# Patient Record
Sex: Male | Born: 1955 | Race: White | Hispanic: No | Marital: Married | Smoking: Never smoker
Health system: Southern US, Community
[De-identification: ages and names within clinical notes are randomized; demographics above are authoritative.]

## PROBLEM LIST (undated history)

## (undated) DIAGNOSIS — G8929 Other chronic pain: Secondary | ICD-10-CM

## (undated) DIAGNOSIS — L02416 Cutaneous abscess of left lower limb: Secondary | ICD-10-CM

## (undated) DIAGNOSIS — A419 Sepsis, unspecified organism: Secondary | ICD-10-CM

## (undated) DIAGNOSIS — L03116 Cellulitis of left lower limb: Secondary | ICD-10-CM

## (undated) DIAGNOSIS — Z87828 Personal history of other (healed) physical injury and trauma: Secondary | ICD-10-CM

## (undated) DIAGNOSIS — F329 Major depressive disorder, single episode, unspecified: Secondary | ICD-10-CM

## (undated) DIAGNOSIS — F32A Depression, unspecified: Secondary | ICD-10-CM

## (undated) DIAGNOSIS — R51 Headache: Secondary | ICD-10-CM

## (undated) DIAGNOSIS — R519 Headache, unspecified: Secondary | ICD-10-CM

## (undated) DIAGNOSIS — M549 Dorsalgia, unspecified: Secondary | ICD-10-CM

## (undated) HISTORY — PX: INTRATHECAL PUMP IMPLANTATION: SHX1844

## (undated) HISTORY — PX: ABDOMINAL SURGERY: SHX537

## (undated) HISTORY — PX: BACK SURGERY: SHX140

---

## 2008-10-06 DIAGNOSIS — S343XXA Injury of cauda equina, initial encounter: Secondary | ICD-10-CM | POA: Insufficient documentation

## 2012-06-29 DIAGNOSIS — K5900 Constipation, unspecified: Secondary | ICD-10-CM | POA: Insufficient documentation

## 2012-06-29 DIAGNOSIS — K219 Gastro-esophageal reflux disease without esophagitis: Secondary | ICD-10-CM | POA: Insufficient documentation

## 2012-06-29 DIAGNOSIS — N319 Neuromuscular dysfunction of bladder, unspecified: Secondary | ICD-10-CM | POA: Insufficient documentation

## 2012-06-29 DIAGNOSIS — F32A Depression, unspecified: Secondary | ICD-10-CM | POA: Diagnosis present

## 2012-06-29 DIAGNOSIS — M62838 Other muscle spasm: Secondary | ICD-10-CM | POA: Insufficient documentation

## 2012-10-30 DIAGNOSIS — I82601 Acute embolism and thrombosis of unspecified veins of right upper extremity: Secondary | ICD-10-CM | POA: Insufficient documentation

## 2013-10-30 DIAGNOSIS — R269 Unspecified abnormalities of gait and mobility: Secondary | ICD-10-CM | POA: Insufficient documentation

## 2014-05-01 DIAGNOSIS — G894 Chronic pain syndrome: Secondary | ICD-10-CM | POA: Diagnosis present

## 2014-10-07 DIAGNOSIS — T85615A Breakdown (mechanical) of other nervous system device, implant or graft, initial encounter: Secondary | ICD-10-CM | POA: Insufficient documentation

## 2014-10-07 DIAGNOSIS — R252 Cramp and spasm: Secondary | ICD-10-CM | POA: Insufficient documentation

## 2014-11-05 DIAGNOSIS — R319 Hematuria, unspecified: Secondary | ICD-10-CM | POA: Insufficient documentation

## 2015-04-19 DIAGNOSIS — F330 Major depressive disorder, recurrent, mild: Secondary | ICD-10-CM | POA: Insufficient documentation

## 2015-04-19 DIAGNOSIS — R5381 Other malaise: Secondary | ICD-10-CM | POA: Insufficient documentation

## 2015-04-19 DIAGNOSIS — E782 Mixed hyperlipidemia: Secondary | ICD-10-CM | POA: Insufficient documentation

## 2015-04-19 DIAGNOSIS — Z720 Tobacco use: Secondary | ICD-10-CM | POA: Insufficient documentation

## 2015-04-19 DIAGNOSIS — I10 Essential (primary) hypertension: Secondary | ICD-10-CM | POA: Insufficient documentation

## 2015-07-14 DIAGNOSIS — Z9889 Other specified postprocedural states: Secondary | ICD-10-CM | POA: Insufficient documentation

## 2016-07-23 DIAGNOSIS — L02416 Cutaneous abscess of left lower limb: Secondary | ICD-10-CM

## 2016-07-23 DIAGNOSIS — A419 Sepsis, unspecified organism: Secondary | ICD-10-CM

## 2016-07-23 DIAGNOSIS — L03116 Cellulitis of left lower limb: Secondary | ICD-10-CM

## 2016-07-23 HISTORY — DX: Cutaneous abscess of left lower limb: L02.416

## 2016-07-23 HISTORY — DX: Sepsis, unspecified organism: A41.9

## 2016-07-23 HISTORY — DX: Cellulitis of left lower limb: L03.116

## 2016-08-09 ENCOUNTER — Inpatient Hospital Stay (HOSPITAL_COMMUNITY)
Admission: EM | Admit: 2016-08-09 | Discharge: 2016-08-12 | DRG: 872 | Disposition: A | Discharge status: Home / Self Care | Payer: Medicare Other | Attending: Internal Medicine | Admitting: Internal Medicine

## 2016-08-09 ENCOUNTER — Encounter (HOSPITAL_COMMUNITY): Payer: Self-pay | Admitting: Emergency Medicine

## 2016-08-09 ENCOUNTER — Emergency Department (HOSPITAL_COMMUNITY): Payer: Medicare Other

## 2016-08-09 DIAGNOSIS — F329 Major depressive disorder, single episode, unspecified: Secondary | ICD-10-CM | POA: Diagnosis present

## 2016-08-09 DIAGNOSIS — G8929 Other chronic pain: Secondary | ICD-10-CM | POA: Diagnosis present

## 2016-08-09 DIAGNOSIS — G8222 Paraplegia, incomplete: Secondary | ICD-10-CM | POA: Diagnosis present

## 2016-08-09 DIAGNOSIS — L03116 Cellulitis of left lower limb: Secondary | ICD-10-CM | POA: Diagnosis present

## 2016-08-09 DIAGNOSIS — Z7982 Long term (current) use of aspirin: Secondary | ICD-10-CM

## 2016-08-09 DIAGNOSIS — Z79891 Long term (current) use of opiate analgesic: Secondary | ICD-10-CM

## 2016-08-09 DIAGNOSIS — A419 Sepsis, unspecified organism: Secondary | ICD-10-CM | POA: Diagnosis not present

## 2016-08-09 DIAGNOSIS — G894 Chronic pain syndrome: Secondary | ICD-10-CM | POA: Diagnosis present

## 2016-08-09 DIAGNOSIS — R740 Nonspecific elevation of levels of transaminase and lactic acid dehydrogenase [LDH]: Secondary | ICD-10-CM | POA: Diagnosis present

## 2016-08-09 DIAGNOSIS — Z8249 Family history of ischemic heart disease and other diseases of the circulatory system: Secondary | ICD-10-CM

## 2016-08-09 DIAGNOSIS — D696 Thrombocytopenia, unspecified: Secondary | ICD-10-CM | POA: Diagnosis present

## 2016-08-09 DIAGNOSIS — Z79899 Other long term (current) drug therapy: Secondary | ICD-10-CM

## 2016-08-09 DIAGNOSIS — F32A Depression, unspecified: Secondary | ICD-10-CM | POA: Diagnosis present

## 2016-08-09 DIAGNOSIS — N400 Enlarged prostate without lower urinary tract symptoms: Secondary | ICD-10-CM | POA: Diagnosis present

## 2016-08-09 DIAGNOSIS — I2510 Atherosclerotic heart disease of native coronary artery without angina pectoris: Secondary | ICD-10-CM | POA: Diagnosis present

## 2016-08-09 DIAGNOSIS — F17220 Nicotine dependence, chewing tobacco, uncomplicated: Secondary | ICD-10-CM | POA: Diagnosis present

## 2016-08-09 DIAGNOSIS — D649 Anemia, unspecified: Secondary | ICD-10-CM | POA: Diagnosis present

## 2016-08-09 HISTORY — DX: Headache, unspecified: R51.9

## 2016-08-09 HISTORY — DX: Headache: R51

## 2016-08-09 HISTORY — DX: Major depressive disorder, single episode, unspecified: F32.9

## 2016-08-09 HISTORY — DX: Sepsis, unspecified organism: A41.9

## 2016-08-09 HISTORY — DX: Cellulitis of left lower limb: L03.116

## 2016-08-09 HISTORY — DX: Depression, unspecified: F32.A

## 2016-08-09 HISTORY — DX: Dorsalgia, unspecified: M54.9

## 2016-08-09 HISTORY — DX: Cutaneous abscess of left lower limb: L02.416

## 2016-08-09 HISTORY — DX: Personal history of other (healed) physical injury and trauma: Z87.828

## 2016-08-09 HISTORY — DX: Other chronic pain: G89.29

## 2016-08-09 LAB — URINALYSIS, ROUTINE W REFLEX MICROSCOPIC
Bilirubin Urine: NEGATIVE
Glucose, UA: NEGATIVE mg/dL
HGB URINE DIPSTICK: NEGATIVE
Ketones, ur: NEGATIVE mg/dL
Leukocytes, UA: NEGATIVE
NITRITE: NEGATIVE
PH: 6 (ref 5.0–8.0)
Protein, ur: NEGATIVE mg/dL
SPECIFIC GRAVITY, URINE: 1.003 — AB (ref 1.005–1.030)

## 2016-08-09 LAB — CBC WITH DIFFERENTIAL/PLATELET
Basophils Absolute: 0 10*3/uL (ref 0.0–0.1)
Basophils Relative: 0 %
EOS ABS: 0.2 10*3/uL (ref 0.0–0.7)
Eosinophils Relative: 1 %
HEMATOCRIT: 36.8 % — AB (ref 39.0–52.0)
HEMOGLOBIN: 12.6 g/dL — AB (ref 13.0–17.0)
LYMPHS ABS: 1 10*3/uL (ref 0.7–4.0)
Lymphocytes Relative: 7 %
MCH: 29.9 pg (ref 26.0–34.0)
MCHC: 34.2 g/dL (ref 30.0–36.0)
MCV: 87.2 fL (ref 78.0–100.0)
MONOS PCT: 6 %
Monocytes Absolute: 0.9 10*3/uL (ref 0.1–1.0)
NEUTROS ABS: 12.4 10*3/uL — AB (ref 1.7–7.7)
NEUTROS PCT: 86 %
Platelets: 111 10*3/uL — ABNORMAL LOW (ref 150–400)
RBC: 4.22 MIL/uL (ref 4.22–5.81)
RDW: 14.1 % (ref 11.5–15.5)
WBC: 14.5 10*3/uL — AB (ref 4.0–10.5)

## 2016-08-09 LAB — I-STAT CG4 LACTIC ACID, ED: Lactic Acid, Venous: 1.1 mmol/L (ref 0.5–1.9)

## 2016-08-09 MED ORDER — VANCOMYCIN HCL IN DEXTROSE 1-5 GM/200ML-% IV SOLN
1000.0000 mg | Freq: Once | INTRAVENOUS | Status: AC
  Administered 2016-08-09: 1000 mg via INTRAVENOUS
  Filled 2016-08-09: qty 200

## 2016-08-09 NOTE — ED Provider Notes (Signed)
[facility]-EMERGENCY DEPT Provider Note   CSN: [id number] Arrival date & time: 08/09/16  2035    History   Chief Complaint Chief Complaint  Patient presents with  . Urinary Tract Infection    HPI Robert Mays is a 61 y.o. male.  61 year old male presents to the emergency department from urgent care secondary to concern for sepsis. Patient noted to be tachycardic with a fever of 102F at urgent care. He reports feeling "bad" recently. He states that his wife and daughter took his temperature at home and he was found to have a fever. He reports some mild nausea. Symptoms have, otherwise, the nonspecific. No recent cough or complaints of shortness of breath. No vomiting or diarrhea. Patient denies dysuria as well as sick contacts. No recent travel or known tick bites. Patient with a history of accident in 1998 leaving him with numbness from the midsternum distally. He has had issues with bowel and bladder incontinence since his most recent surgery in 2017. He is able to walk with a walker at baseline, though finds this difficult. He denies any significant changes from the baseline. Patient was given Tylenol and a shot of Rocephin at urgent care prior to transfer.      Past Medical History:  Diagnosis Date  . Chronic back pain   . H/O spinal cord injury     Patient Active Problem List   Diagnosis Date Noted  . Sepsis (HCC) 08/10/2016    Past Surgical History:  Procedure Laterality Date  . ABDOMINAL SURGERY    . BACK SURGERY        Home Medications    Prior to Admission medications   Medication Sig Start Date End Date Taking? Authorizing Provider  aspirin EC 81 MG tablet Take 81 mg by mouth daily.   Yes [provider]  baclofen (LIORESAL) 20 MG tablet Take 20 mg by mouth 3 (three) times daily. 05/16/16  Yes [provider]  gabapentin (NEURONTIN) 600 MG tablet Take 600 mg by mouth 4 (four) times daily.   Yes [provider]  omeprazole (PRILOSEC) 20 MG  capsule Take 20 mg by mouth daily.   Yes [provider]  Oxcarbazepine (TRILEPTAL) 300 MG tablet Take 300 mg by mouth 2 (two) times daily.   Yes [provider]  oxybutynin (OXYTROL) 3.9 MG/24HR Place 1 patch onto the skin every 3 (three) days.   Yes [provider]  oxycodone (ROXICODONE) 30 MG immediate release tablet Take 30 mg by mouth every 6 (six) hours as needed for pain.   Yes [provider]  tamsulosin (FLOMAX) 0.4 MG CAPS capsule Take 0.4 mg by mouth 3 (three) times daily.   Yes [provider]  venlafaxine (EFFEXOR) 75 MG tablet Take 75 mg by mouth 2 (two) times daily.   Yes [provider]    Family History Family History  Problem Relation Age of Onset  . Heart attack Father        Died in his 3840's    Social History Social History  Substance Use Topics  . Smoking status: Never Smoker  . Smokeless tobacco: Current User    Types: Chew  . Alcohol use No     Allergies   Patient has no known allergies.   Review of Systems Review of Systems Ten systems reviewed and are negative for acute change, except as noted in the HPI.    Physical Exam Updated Vital Signs BP 119/65   Pulse 70   Temp 98.5  F (36.9 C) (Oral)   Resp 13   Ht 5\' 6"  (1.676 m)   Wt 86.2 kg (190 lb)   SpO2 98%   BMI 30.67 kg/m   Physical Exam  Constitutional: He is oriented to person, place, and time. He appears well-developed and well-nourished. No distress.  Nontoxic appearing and in NAD  HENT:  Head: Normocephalic and atraumatic.  Mouth/Throat: Oropharynx is clear and moist.  Eyes: Conjunctivae and EOM are normal. No scleral icterus.  Neck: Normal range of motion.  No meningismus  Cardiovascular: Normal rate, regular rhythm and intact distal pulses.   Pulmonary/Chest: Effort normal. No respiratory distress. He has no wheezes. He has no rales.  Respirations even and unlabored. Lungs grossly clear bilaterally.   Abdominal: Soft. He  exhibits no mass. There is no tenderness. There is no guarding.  Soft, nontender abdomen. No rigidity.  Musculoskeletal: Normal range of motion.       Left lower leg: He exhibits swelling (mild) and edema (minimal, pitting). He exhibits no tenderness.       Legs: Neurological: He is alert and oriented to person, place, and time. Coordination normal.  Skin: Skin is warm and dry. No rash noted. He is not diaphoretic. There is erythema. No pallor.  Psychiatric: He has a normal mood and affect. His behavior is normal.  Nursing note and vitals reviewed.    ED Treatments / Results  Labs (all labs ordered are listed, but only abnormal results are displayed) Labs Reviewed  CBC WITH DIFFERENTIAL/PLATELET - Abnormal; Notable for the following:       Result Value   WBC 14.5 (*)    Hemoglobin 12.6 (*)    HCT 36.8 (*)    Platelets 111 (*)    Neutro Abs 12.4 (*)    All other components within normal limits  URINALYSIS, ROUTINE W REFLEX MICROSCOPIC - Abnormal; Notable for the following:    Color, Urine STRAW (*)    Specific Gravity, Urine 1.003 (*)    All other components within normal limits  COMPREHENSIVE METABOLIC PANEL - Abnormal; Notable for the following:    Sodium 132 (*)    CO2 20 (*)    Glucose, Bld 124 (*)    Calcium 8.3 (*)    Total Protein 6.1 (*)    Albumin 3.3 (*)    AST 76 (*)    All other components within normal limits  CULTURE, BLOOD (ROUTINE X 2)  CULTURE, BLOOD (ROUTINE X 2)  URINE CULTURE  I-STAT CG4 LACTIC ACID, ED    EKG  EKG Interpretation None       Radiology Dg Chest 2 View  Result Date: 08/09/2016 CLINICAL DATA:  Sepsis for several days.  Fever. EXAM: CHEST  2 VIEW COMPARISON:  None. FINDINGS: Heart size is top-normal. There is mild pulmonary vascular congestion consistent with mild CHF. No pneumonic consolidation, effusion or pneumothorax. Low lung volumes are noted. Lobular appearing contour to the mediastinum possibly representing an ectatic or  aneurysmal ascending aorta though other etiologies such is adenopathy or abutting mediastinal mass or not entirely excluded. No acute nor suspicious osseous abnormalities. Degenerative changes are seen along the dorsal spine. IMPRESSION: 1. Low lung volumes with mild vascular congestion consistent CHF. Borderline cardiomegaly. 2. Lobular appearing right mediastinal contour may reflect aneurysmal dilatation or ectasia of the thoracic aorta, adenopathy or mass. This can be further correlated with CT with IV contrast. Electronically Signed   By: Tollie Eth M.D.   On: 08/09/2016 21:24  Ct Angio Chest Pe W And/or Wo Contrast  Result Date: 08/10/2016 CLINICAL DATA:  Fever with possible sepsis. No chest pain or dyspnea. Abnormal appearance on chest x-ray. EXAM: CT ANGIOGRAPHY CHEST WITH CONTRAST TECHNIQUE: Multidetector CT imaging of the chest was performed using the standard protocol during bolus administration of intravenous contrast. Multiplanar CT image reconstructions and MIPs were obtained to evaluate the vascular anatomy. CONTRAST:  100 cc of Isovue 370 IV COMPARISON:  None. 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 No acute pulmonary embolus is identified. There is aortic atherosclerosis without dissection or aneurysm. There is coronary arteriosclerosis predominantly involving the LAD. Mediastinum/Nodes: No enlarged mediastinal, hilar, or axillary lymph nodes. Thyroid gland, trachea, and esophagus demonstrate no significant findings. Lungs/Pleura: Hypoventilatory changes are seen about both lungs likely accounting for the mild diffuse ground-glass opacities noted. Bibasilar atelectasis is seen at each lung base. No pneumonic consolidation is identified. Upper Abdomen: Small hiatal hernia. No acute abnormality within the included upper abdomen. Musculoskeletal:  Thoracic spondylosis. No acute nor suspicious osseous abnormalities. Chronic old left lower rib fractures. Review of the MIP images confirms the above findings. IMPRESSION: 1. The contour abnormality noted on the earlier same day chest radiograph is attributable to a tortuous thoracic aorta along side an the SVC-left brachiocephalic confluence. No focal aortic aneurysm, adenopathy or paramediastinal mass is noted to account for this finding. 2. Bibasilar dependent atelectasis with mild hypoventilatory change of the lungs accounting for ground-glass appearance of the lungs. 3. Coronary arteriosclerosis. Aortic Atherosclerosis (ICD10-I70.0). Electronically Signed   By: Tollie Eth M.D.   On: 08/10/2016 02:12    Procedures Procedures (including critical care time)  Medications Ordered in ED Medications  oxyCODONE (Oxy IR/ROXICODONE) immediate release tablet 30 mg (30 mg Oral Given 08/10/16 0343)  oxybutynin (OXYTROL) 3.9 MG/24HR patch 1 patch (1 patch Transdermal Patch Applied 08/10/16 0419)  baclofen (LIORESAL) tablet 20 mg (20 mg Oral Given 08/10/16 0420)  Oxcarbazepine (TRILEPTAL) tablet 300 mg (300 mg Oral Given 08/10/16 0343)  pantoprazole (PROTONIX) EC tablet 40 mg (not administered)  aspirin EC tablet 81 mg (not administered)  gabapentin (NEURONTIN) tablet 600 mg (not administered)  tamsulosin (FLOMAX) capsule 0.4 mg (not administered)  venlafaxine (EFFEXOR) tablet 75 mg (not administered)  enoxaparin (LOVENOX) injection 40 mg (not administered)  0.9 %  sodium chloride infusion ( Intravenous New Bag/Given 08/10/16 0621)  ondansetron (ZOFRAN) tablet 4 mg (not administered)    Or  ondansetron (ZOFRAN) injection 4 mg (not administered)  acetaminophen (TYLENOL) tablet 650 mg (not administered)    Or  acetaminophen (TYLENOL) suppository 650 mg (not administered)  albuterol (PROVENTIL) (2.5 MG/3ML) 0.083% nebulizer solution 2.5 mg (not administered)  clindamycin (CLEOCIN) IVPB 600 mg (600 mg  Intravenous New Bag/Given 08/10/16 0621)  vancomycin (VANCOCIN) IVPB 1000 mg/200 mL premix (0 mg Intravenous Stopped 08/10/16 0050)  sodium chloride 0.9 % bolus 500 mL (0 mLs Intravenous Stopped 08/10/16 0151)  iopamidol (ISOVUE-370) 76 % injection (100 mLs  Contrast Given 08/10/16 0108)  gabapentin (NEURONTIN) capsule 600 mg (600 mg Oral Given 08/10/16 0343)     Initial Impression / Assessment and Plan / ED Course  I have reviewed the triage vital signs and the nursing notes.  Pertinent labs & imaging results that were available during my care of the patient were reviewed by me and considered in my medical decision making (see chart for details).  Patient is a 60 year old male who presents to the emergency department from urgent care over concern for sepsis. Sepsis suspected secondary to cellulitis of the left lower extremity, though unable to exclude blood clot given history of immobility. Patient given Tylenol by urgent care prior to arrival as well as a shot of Rocephin and 2 L IV fluid. This was supplemented in the emergency department with IV vancomycin.  Case discussed with Dr. Katrinka Blazing of [facility] for observation and additional IV antibiotics. Patient would also likely benefit from venous duplex ultrasound of the left lower extremity in the morning. Patient seen in conjunction with my attending, Dr. Erma Heritage, who is in agreement with this workup and management plan.   Final Clinical Impressions(s) / ED Diagnoses   Final diagnoses:  Sepsis, due to unspecified organism [facility])  Left leg cellulitis    New Prescriptions New Prescriptions   No medications on file     Antony Madura, Cordelia Poche 08/10/16 1610    Shaune Pollack, MD 08/11/16 0530

## 2016-08-09 NOTE — ED Notes (Signed)
Pt received 2 liters of NS enroute to ED

## 2016-08-09 NOTE — ED Triage Notes (Signed)
[facility] [place] [place],  Pt st's he has not felt well in past few days.  Also has had elevated temp. Pt was told he had a UTI

## 2016-08-10 ENCOUNTER — Inpatient Hospital Stay (HOSPITAL_COMMUNITY): Payer: Medicare Other

## 2016-08-10 ENCOUNTER — Encounter (HOSPITAL_COMMUNITY): Payer: Self-pay | Admitting: *Deleted

## 2016-08-10 ENCOUNTER — Emergency Department (HOSPITAL_COMMUNITY): Payer: Medicare Other

## 2016-08-10 DIAGNOSIS — Z79899 Other long term (current) drug therapy: Secondary | ICD-10-CM | POA: Diagnosis not present

## 2016-08-10 DIAGNOSIS — F329 Major depressive disorder, single episode, unspecified: Secondary | ICD-10-CM

## 2016-08-10 DIAGNOSIS — G8929 Other chronic pain: Secondary | ICD-10-CM | POA: Diagnosis present

## 2016-08-10 DIAGNOSIS — A419 Sepsis, unspecified organism: Secondary | ICD-10-CM | POA: Diagnosis present

## 2016-08-10 DIAGNOSIS — Z7982 Long term (current) use of aspirin: Secondary | ICD-10-CM | POA: Diagnosis not present

## 2016-08-10 DIAGNOSIS — D649 Anemia, unspecified: Secondary | ICD-10-CM | POA: Diagnosis present

## 2016-08-10 DIAGNOSIS — D696 Thrombocytopenia, unspecified: Secondary | ICD-10-CM

## 2016-08-10 DIAGNOSIS — Z79891 Long term (current) use of opiate analgesic: Secondary | ICD-10-CM | POA: Diagnosis not present

## 2016-08-10 DIAGNOSIS — L03116 Cellulitis of left lower limb: Secondary | ICD-10-CM | POA: Diagnosis present

## 2016-08-10 DIAGNOSIS — M7989 Other specified soft tissue disorders: Secondary | ICD-10-CM | POA: Diagnosis not present

## 2016-08-10 DIAGNOSIS — G8222 Paraplegia, incomplete: Secondary | ICD-10-CM | POA: Diagnosis present

## 2016-08-10 DIAGNOSIS — R740 Nonspecific elevation of levels of transaminase and lactic acid dehydrogenase [LDH]: Secondary | ICD-10-CM | POA: Diagnosis present

## 2016-08-10 DIAGNOSIS — F32A Depression, unspecified: Secondary | ICD-10-CM | POA: Diagnosis present

## 2016-08-10 DIAGNOSIS — N400 Enlarged prostate without lower urinary tract symptoms: Secondary | ICD-10-CM | POA: Diagnosis present

## 2016-08-10 DIAGNOSIS — G894 Chronic pain syndrome: Secondary | ICD-10-CM | POA: Diagnosis present

## 2016-08-10 DIAGNOSIS — Z8249 Family history of ischemic heart disease and other diseases of the circulatory system: Secondary | ICD-10-CM | POA: Diagnosis not present

## 2016-08-10 DIAGNOSIS — I2510 Atherosclerotic heart disease of native coronary artery without angina pectoris: Secondary | ICD-10-CM | POA: Diagnosis present

## 2016-08-10 DIAGNOSIS — F17220 Nicotine dependence, chewing tobacco, uncomplicated: Secondary | ICD-10-CM | POA: Diagnosis present

## 2016-08-10 LAB — COMPREHENSIVE METABOLIC PANEL
ALBUMIN: 3.3 g/dL — AB (ref 3.5–5.0)
ALK PHOS: 57 U/L (ref 38–126)
ALT: 46 U/L (ref 17–63)
ANION GAP: 9 (ref 5–15)
AST: 76 U/L — AB (ref 15–41)
BILIRUBIN TOTAL: 0.8 mg/dL (ref 0.3–1.2)
BUN: 17 mg/dL (ref 6–20)
CO2: 20 mmol/L — ABNORMAL LOW (ref 22–32)
Calcium: 8.3 mg/dL — ABNORMAL LOW (ref 8.9–10.3)
Chloride: 103 mmol/L (ref 101–111)
Creatinine, Ser: 0.89 mg/dL (ref 0.61–1.24)
GFR calc Af Amer: >60 mL/min (ref >60)
GFR calc non Af Amer: >60 mL/min (ref >60)
GLUCOSE: 124 mg/dL — AB (ref 65–99)
POTASSIUM: 4.1 mmol/L (ref 3.5–5.1)
Sodium: 132 mmol/L — ABNORMAL LOW (ref 135–145)
TOTAL PROTEIN: 6.1 g/dL — AB (ref 6.5–8.1)

## 2016-08-10 MED ORDER — OXCARBAZEPINE 300 MG PO TABS
300.0000 mg | ORAL_TABLET | Freq: Two times a day (BID) | ORAL | Status: DC
  Administered 2016-08-10 – 2016-08-12 (×6): 300 mg via ORAL
  Filled 2016-08-10 (×6): qty 1

## 2016-08-10 MED ORDER — ASPIRIN EC 81 MG PO TBEC
81.0000 mg | DELAYED_RELEASE_TABLET | Freq: Every day | ORAL | Status: DC
  Administered 2016-08-10 – 2016-08-12 (×3): 81 mg via ORAL
  Filled 2016-08-10 (×3): qty 1

## 2016-08-10 MED ORDER — SODIUM CHLORIDE 0.9 % IV BOLUS (SEPSIS)
500.0000 mL | Freq: Once | INTRAVENOUS | Status: AC
  Administered 2016-08-10: 500 mL via INTRAVENOUS

## 2016-08-10 MED ORDER — TAMSULOSIN HCL 0.4 MG PO CAPS
0.4000 mg | ORAL_CAPSULE | Freq: Three times a day (TID) | ORAL | Status: DC
  Administered 2016-08-10 – 2016-08-12 (×7): 0.4 mg via ORAL
  Filled 2016-08-10 (×7): qty 1

## 2016-08-10 MED ORDER — VENLAFAXINE HCL 37.5 MG PO TABS
75.0000 mg | ORAL_TABLET | Freq: Two times a day (BID) | ORAL | Status: DC
Start: 2016-08-10 — End: 2016-08-12
  Administered 2016-08-10 – 2016-08-12 (×5): 75 mg via ORAL
  Filled 2016-08-10 (×3): qty 2
  Filled 2016-08-10: qty 1
  Filled 2016-08-10 (×2): qty 2

## 2016-08-10 MED ORDER — SODIUM CHLORIDE 0.9 % IV SOLN
INTRAVENOUS | Status: DC
  Administered 2016-08-10 – 2016-08-11 (×2): via INTRAVENOUS

## 2016-08-10 MED ORDER — BACLOFEN 20 MG PO TABS
20.0000 mg | ORAL_TABLET | Freq: Three times a day (TID) | ORAL | Status: DC
  Administered 2016-08-10 – 2016-08-12 (×8): 20 mg via ORAL
  Filled 2016-08-10 (×10): qty 1

## 2016-08-10 MED ORDER — GABAPENTIN 300 MG PO CAPS
600.0000 mg | ORAL_CAPSULE | Freq: Four times a day (QID) | ORAL | Status: DC
  Administered 2016-08-10 – 2016-08-12 (×9): 600 mg via ORAL
  Filled 2016-08-10 (×9): qty 2

## 2016-08-10 MED ORDER — CLINDAMYCIN PHOSPHATE 600 MG/50ML IV SOLN
600.0000 mg | Freq: Three times a day (TID) | INTRAVENOUS | Status: DC
  Administered 2016-08-10 – 2016-08-12 (×7): 600 mg via INTRAVENOUS
  Filled 2016-08-10 (×8): qty 50

## 2016-08-10 MED ORDER — ONDANSETRON HCL 4 MG/2ML IJ SOLN: 4.0000 mg | Freq: Four times a day (QID) | INTRAMUSCULAR | Status: DC | PRN

## 2016-08-10 MED ORDER — ACETAMINOPHEN 650 MG RE SUPP: 650.0000 mg | Freq: Four times a day (QID) | RECTAL | Status: DC | PRN

## 2016-08-10 MED ORDER — ONDANSETRON HCL 4 MG PO TABS: 4.0000 mg | ORAL_TABLET | Freq: Four times a day (QID) | ORAL | Status: DC | PRN

## 2016-08-10 MED ORDER — ALBUTEROL SULFATE (2.5 MG/3ML) 0.083% IN NEBU: 2.5000 mg | INHALATION_SOLUTION | RESPIRATORY_TRACT | Status: DC | PRN

## 2016-08-10 MED ORDER — ACETAMINOPHEN 325 MG PO TABS: 650.0000 mg | ORAL_TABLET | Freq: Four times a day (QID) | ORAL | Status: DC | PRN

## 2016-08-10 MED ORDER — IOPAMIDOL (ISOVUE-370) INJECTION 76%
INTRAVENOUS | Status: AC
  Administered 2016-08-10: 100 mL
  Filled 2016-08-10: qty 100

## 2016-08-10 MED ORDER — OXYCODONE HCL 5 MG PO TABS
30.0000 mg | ORAL_TABLET | Freq: Four times a day (QID) | ORAL | Status: DC | PRN
  Administered 2016-08-10 – 2016-08-12 (×8): 30 mg via ORAL
  Filled 2016-08-10 (×8): qty 6

## 2016-08-10 MED ORDER — ENOXAPARIN SODIUM 40 MG/0.4ML ~~LOC~~ SOLN
40.0000 mg | SUBCUTANEOUS | Status: DC
  Administered 2016-08-10 – 2016-08-12 (×3): 40 mg via SUBCUTANEOUS
  Filled 2016-08-10 (×4): qty 0.4

## 2016-08-10 MED ORDER — GABAPENTIN 300 MG PO CAPS
600.0000 mg | ORAL_CAPSULE | Freq: Once | ORAL | Status: AC
  Administered 2016-08-10: 600 mg via ORAL
  Filled 2016-08-10: qty 2

## 2016-08-10 MED ORDER — PANTOPRAZOLE SODIUM 40 MG PO TBEC
40.0000 mg | DELAYED_RELEASE_TABLET | Freq: Every day | ORAL | Status: DC
  Administered 2016-08-10 – 2016-08-12 (×3): 40 mg via ORAL
  Filled 2016-08-10 (×3): qty 1

## 2016-08-10 MED ORDER — OXYBUTYNIN 3.9 MG/24HR TD PTTW
1.0000 | MEDICATED_PATCH | TRANSDERMAL | Status: DC
  Administered 2016-08-10: 1 via TRANSDERMAL
  Filled 2016-08-10: qty 1

## 2016-08-10 NOTE — H&P (Signed)
History and Physical    Robert Mays ZOX:[id number] DOB: 1955/09/27 DOA: 08/09/2016  Referring MD/NP/PA: Antony MaduraKelly Humes, PA-C PCP: Gordan PaymentGrisso, Greg A., MD  Patient coming from: Urgent care facility via EMS  Chief Complaint: Fever  HPI: Robert KellJackie Mays is a 61 y.o. 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 Patient reports not feeling well the last several days and yesterday had been found to have a fever up to 102.75F at home. His family gave him Tylenol and Advil without relief of symptoms. Associated symptoms included mild confusion, left leg redness, and increased warmth and family note was initially seen yesterday. At the urgent care and was tachycardic and febrile up to 102F for which the patient was transferred to our facility for further workup and management. Approximately 3 weeks ago the patient had been evaluated or left ankle swelling and pain. He initially had a x-ray performed on 6/29. 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 Lab work also revealed a mildly elevated sedimentation rate of 48. Patient states that he was told that he had "dead bone"and was treated with a course of steroids. Patient has been mostly bedbound since his last surgical procedure to remove cyst removal of the back in 06/2015. If he ambulates he requires the assistance of a walker. Associated symptoms include frequent falls onto the left leg, incontinence, chronic burning/tingling sensation of his lower extremities that is unchanged, incontinence, muscle spasms, and back pain. Patient is followed [facility] and had just seen him in the office yesterday for follow-up.  ED  Course: On admission into the emergency department patient was seen to be afebrile with vital signs relatively within normal limits. Labs revealed WBC 14.5, hemoglobin 12.6, platelets 111  Review of Systems: Review of Systems  Constitutional: Positive for fever and malaise/fatigue.  HENT: Negative for congestion and sinus pain.   Eyes: Negative for photophobia and pain.  Respiratory: Negative for sputum production.   Cardiovascular: Positive for leg swelling. Negative for chest pain and palpitations.  Gastrointestinal: Negative for nausea and vomiting.  Genitourinary: Negative for dysuria, flank pain and hematuria.  Musculoskeletal: Positive for back pain and falls.  Skin: Positive for rash. Negative for itching.  Neurological: Positive for tingling and sensory change. Negative for loss of consciousness.  Endo/Heme/Allergies: Negative for polydipsia.  Psychiatric/Behavioral: Negative for memory loss and substance abuse.    Past Medical History:  Diagnosis Date  . H/O spinal cord injury     Past Surgical History:  Procedure Laterality Date  . ABDOMINAL SURGERY    . BACK SURGERY       reports that he has never smoked. His smokeless tobacco use includes Chew. He reports that he does not drink alcohol or use drugs.  No Known Allergies  Family History  Problem Relation Age of Onset  . Heart attack Father        Died in his 3640's    Prior to Admission medications   Medication Sig Start Date End Date Taking? Authorizing Provider  aspirin EC 81 MG tablet Take 81 mg by mouth daily.   Yes [provider]  baclofen (LIORESAL) 20 MG tablet Take 20 mg by mouth 3 (three) times daily. 05/16/16  Yes  [provider]  gabapentin (NEURONTIN) 600 MG tablet Take 600 mg by mouth 4 (four) times daily.   Yes [provider]  omeprazole (PRILOSEC) 20 MG capsule Take 20 mg by mouth daily.   Yes [provider]  Oxcarbazepine (TRILEPTAL) 300 MG tablet Take 300 mg by  mouth 2 (two) times daily.   Yes [provider]  oxybutynin (OXYTROL) 3.9 MG/24HR Place 1 patch onto the skin every 3 (three) days.   Yes [provider]  oxycodone (ROXICODONE) 30 MG immediate release tablet Take 30 mg by mouth every 6 (six) hours as needed for pain.   Yes [provider]  tamsulosin (FLOMAX) 0.4 MG CAPS capsule Take 0.4 mg by mouth 3 (three) times daily.   Yes [provider]  venlafaxine (EFFEXOR) 75 MG tablet Take 75 mg by mouth 2 (two) times daily.   Yes [provider]    Physical Exam:  Constitutional: Order male who appears to be in NAD, calm, comfortable Vitals:   08/10/16 0145 08/10/16 0200 08/10/16 0215 08/10/16 0245  BP: (!) 107/59 105/60 109/64 117/60  Pulse: 76 72 77 70  Resp: 16 17 17 17   Temp:      TempSrc:      SpO2: 94% 95% 95% 94%  Weight:      Height:       Eyes: PERRL, lids and conjunctivae normal ENMT: Mucous membranes are moist. Posterior pharynx clear of any exudate or lesions. Neck: normal, supple, no masses, no thyromegaly Respiratory: clear to auscultation bilaterally, no wheezing, no crackles. Normal respiratory effort. No accessory muscle use.  Cardiovascular: Regular rate and rhythm, no murmurs / rubs / gallops. No extremity edema. 2+ pedal pulses. No carotid bruits.  Abdomen: no tenderness, no masses palpated. No hepatosplenomegaly. Bowel sounds positive.  Device palpable underneath the skin of the left lower quadrant of the abdomen Musculoskeletal: no clubbing / cyanosis. No joint deformity upper and lower extremities. Good ROM, no contractures. Normal muscle tone.  Skin: Increased warmth, redness, and swelling of the distal aspect left lower extremity moving proximally. Neurologic: CN 2-12 grossly intact. SensationAbnormal, DTR normal. Strength 3/5 in the bilateral lower extremities Psychiatric: Normal judgment and insight. Alert and oriented x 3. Normal mood.     Labs on Admission: I  have personally reviewed following labs and imaging studies  CBC:  Recent Labs Lab 08/09/16 2301  WBC 14.5*  NEUTROABS 12.4*  HGB 12.6*  HCT 36.8*  MCV 87.2  PLT 111*   Basic Metabolic Panel:  Recent Labs Lab 08/09/16 2355  NA 132*  K 4.1  CL 103  CO2 20*  GLUCOSE 124*  BUN 17  CREATININE 0.89  CALCIUM 8.3*   GFR: Estimated Creatinine Clearance: 88.6 mL/min (by C-G formula based on SCr of 0.89 mg/dL). Liver Function Tests:  Recent Labs Lab 08/09/16 2355  AST 76*  ALT 46  ALKPHOS 57  BILITOT 0.8  PROT 6.1*  ALBUMIN 3.3*   No results for input(s): LIPASE, AMYLASE in the last 168 hours. No results for input(s): AMMONIA in the last 168 hours. Coagulation Profile: No results for input(s): INR, PROTIME in the last 168 hours. Cardiac Enzymes: No results for input(s): CKTOTAL, CKMB, CKMBINDEX, TROPONINI in the last 168 hours. BNP (last 3 results) No results for input(s): PROBNP in the last 8760 hours. HbA1C: No results for input(s): HGBA1C in the last 72 hours. CBG: No results for input(s): GLUCAP in the last 168 hours. Lipid Profile: No results for input(s):  CHOL, HDL, LDLCALC, TRIG, CHOLHDL, LDLDIRECT in the last 72 hours. Thyroid Function Tests: No results for input(s): TSH, T4TOTAL, FREET4, T3FREE, THYROIDAB in the last 72 hours. Anemia Panel: No results for input(s): VITAMINB12, FOLATE, FERRITIN, TIBC, IRON, RETICCTPCT in the last 72 hours. Urine analysis:    Component Value Date/Time   COLORURINE STRAW (A) 08/09/2016 2339   APPEARANCEUR CLEAR 08/09/2016 2339   LABSPEC 1.003 (L) 08/09/2016 2339   PHURINE 6.0 08/09/2016 2339   GLUCOSEU NEGATIVE 08/09/2016 2339   HGBUR NEGATIVE 08/09/2016 2339   BILIRUBINUR NEGATIVE 08/09/2016 2339   KETONESUR NEGATIVE 08/09/2016 2339   PROTEINUR NEGATIVE 08/09/2016 2339   NITRITE NEGATIVE 08/09/2016 2339   LEUKOCYTESUR NEGATIVE 08/09/2016 2339   Sepsis Labs: No results found for this or any previous visit (from  the past 240 hour(s)).   Radiological Exams on Admission: Dg Chest 2 View  Result Date: 08/09/2016 CLINICAL DATA:  Sepsis for several days.  Fever. EXAM: CHEST  2 VIEW COMPARISON:  None. FINDINGS: Heart size is top-normal. There is mild pulmonary vascular congestion consistent with mild CHF. No pneumonic consolidation, effusion or pneumothorax. Low lung volumes are noted. Lobular appearing contour to the mediastinum possibly representing an ectatic or aneurysmal ascending aorta though other etiologies such is adenopathy or abutting mediastinal mass or not entirely excluded. No acute nor suspicious osseous abnormalities. Degenerative changes are seen along the dorsal spine. IMPRESSION: 1. Low lung volumes with mild vascular congestion consistent CHF. Borderline cardiomegaly. 2. Lobular appearing right mediastinal contour may reflect aneurysmal dilatation or ectasia of the thoracic aorta, adenopathy or mass. This can be further correlated with CT with IV contrast. Electronically Signed   By: Tollie Eth M.D.   On: 08/09/2016 21:24   Ct Angio Chest Pe W And/or Wo Contrast  Result Date: 08/10/2016 CLINICAL DATA:  Fever with possible sepsis. No chest pain or dyspnea. Abnormal appearance on chest x-ray. EXAM: CT ANGIOGRAPHY CHEST WITH CONTRAST TECHNIQUE: Multidetector CT imaging of the chest was performed using the standard protocol during bolus administration of intravenous contrast. Multiplanar CT image reconstructions and MIPs were obtained to evaluate the vascular anatomy. CONTRAST:  100 cc of Isovue 370 IV COMPARISON:  None. 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 No acute pulmonary embolus is identified. There is aortic atherosclerosis without dissection or aneurysm. There is coronary arteriosclerosis predominantly involving the LAD. Mediastinum/Nodes: No  enlarged mediastinal, hilar, or axillary lymph nodes. Thyroid gland, trachea, and esophagus demonstrate no significant findings. Lungs/Pleura: Hypoventilatory changes are seen about both lungs likely accounting for the mild diffuse ground-glass opacities noted. Bibasilar atelectasis is seen at each lung base. No pneumonic consolidation is identified. Upper Abdomen: Small hiatal hernia. No acute abnormality within the included upper abdomen. Musculoskeletal: Thoracic spondylosis. No acute nor suspicious osseous abnormalities. Chronic old left lower rib fractures. Review of the MIP images confirms the above findings. IMPRESSION: 1. The contour abnormality noted on the earlier same day chest radiograph is attributable to a tortuous thoracic aorta along side an the SVC-left brachiocephalic confluence. No focal aortic aneurysm, adenopathy or paramediastinal mass is noted to account for this finding. 2. Bibasilar dependent atelectasis with mild hypoventilatory change of the lungs accounting for ground-glass appearance of the lungs. 3. Coronary arteriosclerosis. Aortic Atherosclerosis (ICD10-I70.0). Electronically Signed   By: Tollie Eth M.D.   On: 08/10/2016 02:12    EKG: Independently reviewed. Normal sinus rhythm  Assessment/Plan Sepsis 2/2 Suspect Cellulitis of the left leg: Acute. Patient presented initially to an [place] urgent care was found to be tachycardic and febrile up to 102F. On admission WBC elevated at 14.5 and lactic acid reassuring at 1.1. Sepsis protocol was initiated with bolus of 500 mL of normal saline fluids and the patient was initially given antibiotics of vancomycin. - Admit to a MedSurg bed - Follow-up blood cultures - Changed antibiotics to clindamycin - Recheck CBC in a.m.  - Check a duplex Doppler ultrasound to rule out possibility of DVT  Chronic pain s/p intrathecal pump - Continue gabapentin, oxycodone  prn  Incomplete paraplegia secondary to work-related injury in  1998 - Continue baclofen   Depression -  Continue Effexor   Transaminitis: Acute. AST elevated at 76.  - Rechecking CMP in a.m.   Thrombocytopenia: Acute on chronic. Initial platelet count 111 on admission. Review of care everywhere records reveals that the patient also had low platelet counts-2016. - Recheck CBC in a.m.  DVT prophylaxis:  lovenox  Code Status: Full Family Communication: Discussed plan of care with the patient and family present at bedside Disposition Plan: Likely discharge home once medically stable Consults called: none Admission status:Inpatient  Clydie Braun MD [facility] Pager [phone]  If 7PM-7AM, please contact night-coverage [email or web address] Password [facility]  08/10/2016, 3:57 AM

## 2016-08-10 NOTE — ED Notes (Signed)
Pt to CT at this time.

## 2016-08-10 NOTE — ED Notes (Signed)
Pt drinking cold soda

## 2016-08-10 NOTE — Progress Notes (Signed)
Received report from Raviahristina, RN in the ED.

## 2016-08-10 NOTE — ED Notes (Signed)
Attempted report 

## 2016-08-10 NOTE — Progress Notes (Signed)
VASCULAR LAB PRELIMINARY  PRELIMINARY  PRELIMINARY  PRELIMINARY  Left lower extremity venous duplex completed.    Preliminary report:  Left:  No evidence of DVT, superficial thrombosis, or Baker's cyst.  Robert Mays, RVS 08/10/2016, 9:28 AM

## 2016-08-10 NOTE — ED Notes (Signed)
6N notified pt will be taken to vascular and then upstairs. Will call back for report in 5 minutes.

## 2016-08-10 NOTE — Progress Notes (Signed)
Robert Mays is a 61 y.o. 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 He was found to have left leg erythema and warmth, and was admitted for left leg cellulitis. He was started on clindamycin and US lower extremity ordered to evaluate for DVT.  Pt was admitted earlier by Dr Katrinka Blazingsmith and see his note in detail for H&P.    Plan:  1. Continue with IV clindamycin 2. US to evaluate for DVT.  3. Resume home medications.  4. Follow up blood cultures and urine cultures.    Kathlen ModyVijaya Emerald Shor, MD [phone]

## 2016-08-11 DIAGNOSIS — G8929 Other chronic pain: Secondary | ICD-10-CM

## 2016-08-11 DIAGNOSIS — L03116 Cellulitis of left lower limb: Secondary | ICD-10-CM

## 2016-08-11 LAB — COMPREHENSIVE METABOLIC PANEL
ALBUMIN: 3 g/dL — AB (ref 3.5–5.0)
ALT: 55 U/L (ref 17–63)
ANION GAP: 7 (ref 5–15)
AST: 55 U/L — AB (ref 15–41)
Alkaline Phosphatase: 62 U/L (ref 38–126)
BUN: 12 mg/dL (ref 6–20)
CHLORIDE: 107 mmol/L (ref 101–111)
CO2: 24 mmol/L (ref 22–32)
Calcium: 8.6 mg/dL — ABNORMAL LOW (ref 8.9–10.3)
Creatinine, Ser: 0.89 mg/dL (ref 0.61–1.24)
GFR calc Af Amer: >60 mL/min (ref >60)
GFR calc non Af Amer: >60 mL/min (ref >60)
GLUCOSE: 122 mg/dL — AB (ref 65–99)
POTASSIUM: 4.2 mmol/L (ref 3.5–5.1)
SODIUM: 138 mmol/L (ref 135–145)
TOTAL PROTEIN: 5.8 g/dL — AB (ref 6.5–8.1)
Total Bilirubin: 0.3 mg/dL (ref 0.3–1.2)

## 2016-08-11 LAB — CBC
HEMATOCRIT: 33.4 % — AB (ref 39.0–52.0)
Hemoglobin: 11.6 g/dL — ABNORMAL LOW (ref 13.0–17.0)
MCH: 30.4 pg (ref 26.0–34.0)
MCHC: 34.7 g/dL (ref 30.0–36.0)
MCV: 87.7 fL (ref 78.0–100.0)
PLATELETS: 142 10*3/uL — AB (ref 150–400)
RBC: 3.81 MIL/uL — ABNORMAL LOW (ref 4.22–5.81)
RDW: 14.7 % (ref 11.5–15.5)
WBC: 7.5 10*3/uL (ref 4.0–10.5)

## 2016-08-11 NOTE — Progress Notes (Signed)
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 He was found to have left leg erythema and warmth, and was admitted for left leg cellulitis. He was started on clindamycin and US lower extremity ordered to evaluate for DVT. It was negative for DVT.   Assessment & Plan:    Active Problems:   Cellulitis of left leg   Chronic pain   Thrombocytopenia (HCC)   Depression   Left leg cellulitis: Started on IV clindamycin, erythema was improving. But slight increase in ankle edema, d.c iv FLUIDS.  Elevated the legs.  Venous duplex is negative for dvt.  Recommend to continue with IV clindamycin another 24 hours.  Work with PT.   Depression:  Resume home meds.   BPH: Resume flomax.   Chronic pain syndrome:  Resume home meds.   Mild normocytic anemia  Hemoglobin stable at 11.     DVT prophylaxis: (Lovenox) Code Status: (Full Family Communication: (none at bedside.  Disposition Plan: pending PT evaluation.    Consultants:   NONE.    Procedures: VENOUS DUPLEX.    Antimicrobials: clindamycin since admission.    Subjective: His pain hasn't improved.  No chest pain or sob.   Objective: Vitals:   08/10/16 1519 08/10/16 2100 08/11/16 0500 08/11/16 1359  BP: (!) 112/56 114/62 (!) 101/55 115/65  Pulse: 68 96 87 85  Resp: 16 19 18 18   Temp: 98 F (36.7 C) 98.3 F (36.8 C) 98.2 F (36.8 C) 98 F (36.7 C)  TempSrc: Oral Oral Oral Oral  SpO2: 98% 100% 99% 100%  Weight:      Height:        Intake/Output Summary (Last 24 hours) at 08/11/16 1648 Last data filed at 08/11/16 1617  Gross per 24 hour  Intake           2754.25 ml  Output             1700 ml  Net          1054.25 ml   Filed Weights   08/09/16 2103  Weight: 86.2 kg (190 lb)    Examination:  General exam: Appears calm and comfortable  Respiratory system: Clear to auscultation. Respiratory effort normal. Cardiovascular system: S1 & S2 heard, RRR. No JVD, murmurs, rubs, gallops or clicks. No pedal edema. Gastrointestinal system: Abdomen is nondistended, soft and nontender. No organomegaly or masses felt. Normal bowel sounds heard. Central nervous system: Alert and oriented. No focal neurological deficits. Extremities: left leg swelling, more in the ankle, erythema has improved. But swelling is slightly worse than yesterday.  Skin: No rashes, lesions or ulcers Psychiatry: Judgement and insight appear normal. Mood & affect appropriate.     Data Reviewed: I have personally reviewed following labs and imaging studies  CBC:  Recent Labs Lab 08/09/16 2301 08/11/16 0400  WBC 14.5* 7.5  NEUTROABS 12.4*  --   HGB 12.6* 11.6*  HCT 36.8* 33.4*  MCV 87.2 87.7  PLT 111* 142*   Basic Metabolic Panel:  Recent Labs Lab 08/09/16 2355 08/11/16 0619  NA 132* 138  K 4.1 4.2  CL 103 107  CO2 20* 24  GLUCOSE 124* 122*  BUN 17  12  CREATININE 0.89 0.89  CALCIUM 8.3* 8.6*   GFR: Estimated Creatinine Clearance: 88.6 mL/min (by C-G formula based on SCr of 0.89 mg/dL). Liver Function Tests:  Recent Labs Lab 08/09/16 2355 08/11/16 0619  AST 76* 55*  ALT 46 55  ALKPHOS 57 62  BILITOT 0.8 0.3  PROT 6.1* 5.8*  ALBUMIN 3.3* 3.0*   No results for input(s): LIPASE, AMYLASE in the last 168 hours. No results for input(s): AMMONIA in the last 168 hours. Coagulation Profile: No results for input(s): INR, PROTIME in the last 168 hours. Cardiac Enzymes: No results for input(s): CKTOTAL, CKMB, CKMBINDEX, TROPONINI in the last 168 hours. BNP (last 3 results) No results for input(s): PROBNP in the last 8760 hours. HbA1C: No  results for input(s): HGBA1C in the last 72 hours. CBG: No results for input(s): GLUCAP in the last 168 hours. Lipid Profile: No results for input(s): CHOL, HDL, LDLCALC, TRIG, CHOLHDL, LDLDIRECT in the last 72 hours. Thyroid Function Tests: No results for input(s): TSH, T4TOTAL, FREET4, T3FREE, THYROIDAB in the last 72 hours. Anemia Panel: No results for input(s): VITAMINB12, FOLATE, FERRITIN, TIBC, IRON, RETICCTPCT in the last 72 hours. Sepsis Labs:  Recent Labs Lab 08/09/16 2311  LATICACIDVEN 1.10    Recent Results (from the past 240 hour(s))  Blood Culture (routine x 2)     Status: None (Preliminary result)   Collection Time: 08/09/16 10:14 PM  Result Value Ref Range Status   Specimen Description BLOOD RIGHT HAND  Final   Special Requests IN PEDIATRIC BOTTLE Blood Culture adequate volume  Final   Culture NO GROWTH 2 DAYS  Final   Report Status PENDING  Incomplete  Blood Culture (routine x 2)     Status: None (Preliminary result)   Collection Time: 08/09/16 10:14 PM  Result Value Ref Range Status   Specimen Description BLOOD LEFT ANTECUBITAL  Final   Special Requests IN PEDIATRIC BOTTLE Blood Culture adequate volume  Final   Culture NO GROWTH 2 DAYS  Final   Report Status PENDING  Incomplete  Urine culture     Status: Abnormal (Preliminary result)   Collection Time: 08/09/16 11:40 PM  Result Value Ref Range Status   Specimen Description URINE, RANDOM  Final   Special Requests NONE  Final   Culture 40,000 COLONIES/mL ENTEROCOCCUS FAECALIS (A)  Final   Report Status PENDING  Incomplete         Radiology Studies: Dg Chest 2 View  Result Date: 08/09/2016 CLINICAL DATA:  Sepsis for several days.  Fever. EXAM: CHEST  2 VIEW COMPARISON:  None. FINDINGS: Heart size is top-normal. There is mild pulmonary vascular congestion consistent with mild CHF. No pneumonic consolidation, effusion or pneumothorax. Low lung volumes are noted. Lobular appearing contour to the mediastinum  possibly representing an ectatic or aneurysmal ascending aorta though other etiologies such is adenopathy or abutting mediastinal mass or not entirely excluded. No acute nor suspicious osseous abnormalities. Degenerative changes are seen along the dorsal spine. IMPRESSION: 1. Low lung volumes with mild vascular congestion consistent CHF. Borderline cardiomegaly. 2. Lobular appearing right mediastinal contour may reflect aneurysmal dilatation or ectasia of the thoracic aorta, adenopathy or mass. This can be further correlated with CT with IV contrast. Electronically Signed   By: Tollie Eth M.D.   On: 08/09/2016 21:24   Ct Angio Chest Pe W And/or Wo Contrast  Result Date: 08/10/2016 CLINICAL DATA:  Fever with possible sepsis. No chest pain or dyspnea. Abnormal appearance on chest x-ray. EXAM: CT  ANGIOGRAPHY CHEST WITH CONTRAST TECHNIQUE: Multidetector CT imaging of the chest was performed using the standard protocol during bolus administration of intravenous contrast. Multiplanar CT image reconstructions and MIPs were obtained to evaluate the vascular anatomy. CONTRAST:  100 cc of Isovue 370 IV COMPARISON:  None. 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 No acute pulmonary embolus is identified. There is aortic atherosclerosis without dissection or aneurysm. There is coronary arteriosclerosis predominantly involving the LAD. Mediastinum/Nodes: No enlarged mediastinal, hilar, or axillary lymph nodes. Thyroid gland, trachea, and esophagus demonstrate no significant findings. Lungs/Pleura: Hypoventilatory changes are seen about both lungs likely accounting for the mild diffuse ground-glass opacities noted. Bibasilar atelectasis is seen at each lung base. No pneumonic consolidation is identified. Upper Abdomen: Small hiatal hernia. No acute abnormality within the  included upper abdomen. Musculoskeletal: Thoracic spondylosis. No acute nor suspicious osseous abnormalities. Chronic old left lower rib fractures. Review of the MIP images confirms the above findings. IMPRESSION: 1. The contour abnormality noted on the earlier same day chest radiograph is attributable to a tortuous thoracic aorta along side an the SVC-left brachiocephalic confluence. No focal aortic aneurysm, adenopathy or paramediastinal mass is noted to account for this finding. 2. Bibasilar dependent atelectasis with mild hypoventilatory change of the lungs accounting for ground-glass appearance of the lungs. 3. Coronary arteriosclerosis. Aortic Atherosclerosis (ICD10-I70.0). Electronically Signed   By: Tollie Eth M.D.   On: 08/10/2016 02:12        Scheduled Meds: . aspirin EC  81 mg Oral Daily  . baclofen  20 mg Oral TID  . enoxaparin (LOVENOX) injection  40 mg Subcutaneous Q24H  . gabapentin  600 mg Oral QID  . Oxcarbazepine  300 mg Oral BID  . oxybutynin  1 patch Transdermal Q72H  . pantoprazole  40 mg Oral Daily  . tamsulosin  0.4 mg Oral TID  . venlafaxine  75 mg Oral BID   Continuous Infusions: . clindamycin (CLEOCIN) IV Stopped (08/11/16 1500)     LOS: 1 day    Time spent: 35 minutes.     Kathlen Mody, MD [facility] Pager [phone]   If 7PM-7AM, please contact night-coverage [email or web address] Password [facility] 08/11/2016, 4:48 PM

## 2016-08-12 LAB — URINE CULTURE: Culture: 40000 — AB

## 2016-08-12 MED ORDER — CLINDAMYCIN HCL 300 MG PO CAPS: 300.0000 mg | ORAL_CAPSULE | Freq: Three times a day (TID) | ORAL | 0 refills | Status: AC

## 2016-08-12 NOTE — Evaluation (Signed)
Physical Therapy Evaluation Patient Details Name: Robert Mays MRN: [id number] DOB: January 27, 1955 Today's Date: 08/12/2016   History of Present Illness  Robert Mays is a 61 y.o. 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 He was found to have left leg erythema and warmth, and was admitted for left leg cellulitis. He was started on clindamycin and [place] lower extremity ordered to evaluate for DVT. It was negative for DVT.      Clinical Impression  Patient was able to ambulate 42' with supervision and RW.  Close to/at baseline.  Patient does have significant problem moving supine > sit.  Recommend rails for his bed.  Patient also unsafe getting into/out of tub - having difficulty moving LE's over tub.  Recommend a walk-in tub for patient safety.  Wife to work with case worker on these items.  Do not identify any further PT needs.  Patient safe for d/c home from PT perspective.    Follow Up Recommendations No PT follow up;Supervision for mobility/OOB    Equipment Recommendations  Other (comment) (Rails for his bed; walk-in tub for safety)    Recommendations for Other Services       Precautions / Restrictions Precautions Precautions: Fall Restrictions Weight Bearing Restrictions: No      Mobility  Bed Mobility Overal bed mobility: Needs Assistance Bed Mobility: Rolling;Sidelying to Sit Rolling: Modified independent (Device/Increase time) Sidelying to sit: Min assist;HOB elevated       General bed mobility comments: Patient requires assist to raise trunk to sitting position with no bed rail.  At home patient reports he pulls up on night stand.  Transfers Overall transfer level: Modified independent Equipment used: Rolling walker (2 wheeled)                 Ambulation/Gait Ambulation/Gait assistance: Supervision Ambulation Distance (Feet): 50 Feet Assistive device: Rolling walker (2 wheeled) Gait Pattern/deviations: Step-through pattern;Decreased stride length;Decreased step length - right;Decreased step length - left;Shuffle Gait velocity: decreased Gait velocity interpretation: Below normal speed for age/gender General Gait Details: Patient with shuffling, slow gait with LE's in hip/knee flexion.  Demonstrates safe use of RW  Stairs            Wheelchair Mobility    Modified Rankin (Stroke Patients Only)       Balance Overall balance assessment: Needs assistance         Standing balance support: Bilateral upper extremity supported Standing balance-Leahy Scale: Poor                               Pertinent Vitals/Pain Pain Assessment: Faces Faces Pain Scale: Hurts little more Pain Location: Lt foot Pain Descriptors / Indicators: Aching;Grimacing;Pressure;Sore Pain Intervention(s): Limited activity within patient's tolerance;Monitored during session;Repositioned    Home Living Family/patient expects to be discharged to:: Private residence Living Arrangements: Spouse/significant other;Children (Son and daughter-in-law) Available Help at Discharge: Family;Available 24 hours/day Type of Home: House Home Access: Ramped entrance     Home Layout: Able to live on main level with bedroom/bathroom Home Equipment: Walker - 4 wheels;Bedside commode;Tub bench;Hand held shower head;Transport chair;Other (comment) (Adjustable bed)      Prior Function Level of Independence: Independent with assistive device(s);Needs assistance   Gait / Transfers Assistance Needed: Uses rollator for gait.  ADL's /  Homemaking Assistance Needed: Wife completes set-up for bathing and patient able to complete bath.  Difficulty getting legs over tub.  Family provide meal prep, housekeeping.        Hand Dominance         Extremity/Trunk Assessment   Upper Extremity Assessment Upper Extremity Assessment: Overall WFL for tasks assessed    Lower Extremity Assessment Lower Extremity Assessment: RLE deficits/detail;LLE deficits/detail RLE Deficits / Details: Strength grossly 3-/5;  spasticity into flexion. RLE Coordination: decreased gross motor LLE Deficits / Details: Strength grossly 3-/5;  spasticity into flexion; edema/redness in lower leg and foot LLE Coordination: decreased gross motor       Communication   Communication: No difficulties  Cognition Arousal/Alertness: Awake/alert Behavior During Therapy: WFL for tasks assessed/performed;Impulsive Overall Cognitive Status: Within Functional Limits for tasks assessed                                 General Comments: Wife has to correct patient on time-related events.      General Comments      Exercises     Assessment/Plan    PT Assessment Patent does not need any further PT services  PT Problem List         PT Treatment Interventions      PT Goals (Current goals can be found in the Care Plan section)  Acute Rehab PT Goals Patient Stated Goal: To go home today PT Goal Formulation: All assessment and education complete, DC therapy    Frequency     Barriers to discharge        Co-evaluation               AM-PAC PT "6 Clicks" Daily Activity  Outcome Measure Difficulty turning over in bed (including adjusting bedclothes, sheets and blankets)?: None Difficulty moving from lying on back to sitting on the side of the bed? : A Little Difficulty sitting down on and standing up from a chair with arms (e.g., wheelchair, bedside commode, etc,.)?: None Help needed moving to and from a bed to chair (including a wheelchair)?: None Help needed walking in hospital room?: A Little Help needed climbing 3-5 steps with a railing? : A Lot 6 Click Score: 20    End of Session Equipment Utilized During Treatment: Gait  belt Activity Tolerance: Patient tolerated treatment well Patient left: in bed;with call bell/phone within reach;with family/visitor present (sitting EOB) Nurse Communication: Mobility status (Ready for d/c from PT perspective) PT Visit Diagnosis: Unsteadiness on feet (R26.81);Other abnormalities of gait and mobility (R26.89);Muscle weakness (generalized) (M62.81);Pain Pain - Right/Left: Left Pain - part of body: Ankle and joints of foot;Leg    Time: [id number] PT Time Calculation (min) (ACUTE ONLY): 27 min   Charges:   PT Evaluation $PT Eval Moderate Complexity: 1 Procedure PT Treatments $Gait Training: 8-22 mins   PT G Codes:        Durenda HurtSusan H. Renaldo Fiddleravis, PT, [facility] Acute Rehab Services Pager [phone]   Vena AustriaSusan H Eryck Negron 08/12/2016, 12:48 PM

## 2016-08-14 LAB — CULTURE, BLOOD (ROUTINE X 2)
CULTURE: NO GROWTH
CULTURE: NO GROWTH
SPECIAL REQUESTS: ADEQUATE
Special Requests: ADEQUATE

## 2016-08-14 NOTE — Discharge Summary (Signed)
Physician Discharge Summary  Robert Mays ZOX:[id number] DOB: 1955/03/19 DOA: 08/09/2016  PCP: Gordan Payment., MD  Admit date: 08/09/2016 Discharge date: 08/12/2016  Admitted From: Home.  Disposition:  Home.   Recommendations for Outpatient Follow-up:  1. Follow up with PCP in 1-2 weeks 2. Please obtain BMP/CBC in one week   Discharge Condition:stable.  CODE STATUS: full code.  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 He was found to have left leg erythema and warmth, and was admitted for left leg cellulitis. He was started on clindamycin and [place] lower extremity ordered to evaluate for DVT. It was negative for DVT.   Discharge Diagnoses:  Principal Problem:   Sepsis (HCC) Active Problems:   Cellulitis of left leg   Chronic pain   Thrombocytopenia (HCC)   Depression  Left leg cellulitis: Started on IV clindamycin, erythema was improving. But slight increase in ankle edema,so we discontinued IV fluids.  Elevated the legs.  Venous duplex is negative for dvt.  Recommend to continue with clindamycin for another 10 days to complete the course.    Depression:  Resume home meds.   BPH: Resume flomax.   Chronic pain syndrome:  Resume home meds.   Mild normocytic anemia  Hemoglobin stable at 11.    Discharge Instructions  Discharge Instructions    Diet - low sodium heart healthy    Complete by:  As directed    Discharge instructions    Complete by:  As directed    Please follow up with PCP in one week, before the antibiotics are completed.     Allergies as of 08/12/2016   No Known Allergies     Medication List    TAKE these medications   aspirin EC 81 MG  tablet Take 81 mg by mouth daily.   baclofen 20 MG tablet Commonly known as:  LIORESAL Take 20 mg by mouth 3 (three) times daily.   clindamycin 300 MG capsule Commonly known as:  CLEOCIN Take 1 capsule (300 mg total) by mouth 3 (three) times daily.   gabapentin 600 MG tablet Commonly known as:  NEURONTIN Take 600 mg by mouth 4 (four) times daily.   omeprazole 20 MG capsule Commonly known as:  PRILOSEC Take 20 mg by mouth daily.   Oxcarbazepine 300 MG tablet Commonly known as:  TRILEPTAL Take 300 mg by mouth 2 (two) times daily.   oxybutynin 3.9 MG/24HR Commonly known as:  OXYTROL Place 1 patch onto the skin every 3 (three) days.   oxycodone 30 MG immediate release tablet Commonly known as:  ROXICODONE Take 30 mg by mouth every 6 (six) hours as needed for pain.   tamsulosin 0.4 MG Caps capsule Commonly known as:  FLOMAX Take 0.4 mg by mouth 3 (three) times daily.   venlafaxine 75 MG tablet Commonly known as:  EFFEXOR Take 75 mg by mouth 2 (two) times daily.       No Known Allergies  Consultations:  None.    Procedures/Studies: Dg Chest 2 View  Result Date: 08/09/2016 CLINICAL DATA:  Sepsis for several days.  Fever. EXAM: CHEST  2 VIEW COMPARISON:  None. FINDINGS: Heart size is top-normal. There is mild pulmonary vascular congestion consistent with mild CHF. No pneumonic consolidation, effusion or pneumothorax.  Low lung volumes are noted. Lobular appearing contour to the mediastinum possibly representing an ectatic or aneurysmal ascending aorta though other etiologies such is adenopathy or abutting mediastinal mass or not entirely excluded. No acute nor suspicious osseous abnormalities. Degenerative changes are seen along the dorsal spine. IMPRESSION: 1. Low lung volumes with mild vascular congestion consistent CHF. Borderline cardiomegaly. 2. Lobular appearing right mediastinal contour may reflect aneurysmal dilatation or ectasia of the thoracic aorta, adenopathy or  mass. This can be further correlated with CT with IV contrast. Electronically Signed   By: Tollie Eth M.D.   On: 08/09/2016 21:24   Ct Angio Chest Pe W And/or Wo Contrast  Result Date: 08/10/2016 CLINICAL DATA:  Fever with possible sepsis. No chest pain or dyspnea. Abnormal appearance on chest x-ray. EXAM: CT ANGIOGRAPHY CHEST WITH CONTRAST TECHNIQUE: Multidetector CT imaging of the chest was performed using the standard protocol during bolus administration of intravenous contrast. Multiplanar CT image reconstructions and MIPs were obtained to evaluate the vascular anatomy. CONTRAST:  100 cc of Isovue 370 IV COMPARISON:  None. 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 No acute pulmonary embolus is identified. There is aortic atherosclerosis without dissection or aneurysm. There is coronary arteriosclerosis predominantly involving the LAD. Mediastinum/Nodes: No enlarged mediastinal, hilar, or axillary lymph nodes. Thyroid gland, trachea, and esophagus demonstrate no significant findings. Lungs/Pleura: Hypoventilatory changes are seen about both lungs likely accounting for the mild diffuse ground-glass opacities noted. Bibasilar atelectasis is seen at each lung base. No pneumonic consolidation is identified. Upper Abdomen: Small hiatal hernia. No acute abnormality within the included upper abdomen. Musculoskeletal: Thoracic spondylosis. No acute nor suspicious osseous abnormalities. Chronic old left lower rib fractures. Review of the MIP images confirms the above findings. IMPRESSION: 1. The contour abnormality noted on the earlier same day chest radiograph is attributable to a tortuous thoracic aorta along side an the SVC-left brachiocephalic confluence. No focal aortic aneurysm, adenopathy or paramediastinal mass is noted to account for this finding. 2. Bibasilar  dependent atelectasis with mild hypoventilatory change of the lungs accounting for ground-glass appearance of the lungs. 3. Coronary arteriosclerosis. Aortic Atherosclerosis (ICD10-I70.0). Electronically Signed   By: Tollie Eth M.D.   On: 08/10/2016 02:12       Subjective: No new complaints.   Discharge Exam: Vitals:   08/11/16 2026 08/12/16 0624  BP: 121/70 (!) 146/78  Pulse: 71 62  Resp: 18 18  Temp: 98.8 F (37.1 C) 98.2 F (36.8 C)   Vitals:   08/11/16 0500 08/11/16 1359 08/11/16 2026 08/12/16 0624  BP: (!) 101/55 115/65 121/70 (!) 146/78  Pulse: 87 85 71 62  Resp: 18 18 18 18   Temp: 98.2 F (36.8 C) 98 F (36.7 C) 98.8 F (37.1 C) 98.2 F (36.8 C)  TempSrc: Oral Oral Oral Oral  SpO2: 99% 100% 97% 99%  Weight:      Height:        General: Pt is alert, awake, not in acute distress Cardiovascular: RRR, S1/S2 +, no rubs, no gallops Respiratory: CTA bilaterally, no wheezing, no rhonchi Abdominal: Soft, NT, ND, bowel sounds + Extremities: no edema, no cyanosis    The results of significant diagnostics from this hospitalization (including imaging, microbiology, ancillary and laboratory) are listed below for reference.     Microbiology: Recent Results (from the past 240 hour(s))  Blood Culture (routine x 2)  Status: None (Preliminary result)   Collection Time: 08/09/16 10:14 PM  Result Value Ref Range Status   Specimen Description BLOOD RIGHT HAND  Final   Special Requests IN PEDIATRIC BOTTLE Blood Culture adequate volume  Final   Culture NO GROWTH 4 DAYS  Final   Report Status PENDING  Incomplete  Blood Culture (routine x 2)     Status: None (Preliminary result)   Collection Time: 08/09/16 10:14 PM  Result Value Ref Range Status   Specimen Description BLOOD LEFT ANTECUBITAL  Final   Special Requests IN PEDIATRIC BOTTLE Blood Culture adequate volume  Final   Culture NO GROWTH 4 DAYS  Final   Report Status PENDING  Incomplete  Urine culture     Status:  Abnormal   Collection Time: 08/09/16 11:40 PM  Result Value Ref Range Status   Specimen Description URINE, RANDOM  Final   Special Requests NONE  Final   Culture 40,000 COLONIES/mL ENTEROCOCCUS FAECALIS (A)  Final   Report Status 08/12/2016 FINAL  Final   Organism ID, Bacteria ENTEROCOCCUS FAECALIS (A)  Final      Susceptibility   Enterococcus faecalis - MIC*    AMPICILLIN <=2 SENSITIVE Sensitive     LEVOFLOXACIN 1 SENSITIVE Sensitive     NITROFURANTOIN <=16 SENSITIVE Sensitive     VANCOMYCIN 2 SENSITIVE Sensitive     * 40,000 COLONIES/mL ENTEROCOCCUS FAECALIS     Labs: BNP (last 3 results) No results for input(s): BNP in the last 8760 hours. Basic Metabolic Panel:  Recent Labs Lab 08/09/16 2355 08/11/16 0619  NA 132* 138  K 4.1 4.2  CL 103 107  CO2 20* 24  GLUCOSE 124* 122*  BUN 17 12  CREATININE 0.89 0.89  CALCIUM 8.3* 8.6*   Liver Function Tests:  Recent Labs Lab 08/09/16 2355 08/11/16 0619  AST 76* 55*  ALT 46 55  ALKPHOS 57 62  BILITOT 0.8 0.3  PROT 6.1* 5.8*  ALBUMIN 3.3* 3.0*   No results for input(s): LIPASE, AMYLASE in the last 168 hours. No results for input(s): AMMONIA in the last 168 hours. CBC:  Recent Labs Lab 08/09/16 2301 08/11/16 0400  WBC 14.5* 7.5  NEUTROABS 12.4*  --   HGB 12.6* 11.6*  HCT 36.8* 33.4*  MCV 87.2 87.7  PLT 111* 142*   Cardiac Enzymes: No results for input(s): CKTOTAL, CKMB, CKMBINDEX, TROPONINI in the last 168 hours. BNP: Invalid input(s): POCBNP CBG: No results for input(s): GLUCAP in the last 168 hours. D-Dimer No results for input(s): DDIMER in the last 72 hours. Hgb A1c No results for input(s): HGBA1C in the last 72 hours. Lipid Profile No results for input(s): CHOL, HDL, LDLCALC, TRIG, CHOLHDL, LDLDIRECT in the last 72 hours. Thyroid function studies No results for input(s): TSH, T4TOTAL, T3FREE, THYROIDAB in the last 72 hours.  Invalid input(s): FREET3 Anemia work up No results for input(s):  VITAMINB12, FOLATE, FERRITIN, TIBC, IRON, RETICCTPCT in the last 72 hours. Urinalysis    Component Value Date/Time   COLORURINE STRAW (A) 08/09/2016 2339   APPEARANCEUR CLEAR 08/09/2016 2339   LABSPEC 1.003 (L) 08/09/2016 2339   PHURINE 6.0 08/09/2016 2339   GLUCOSEU NEGATIVE 08/09/2016 2339   HGBUR NEGATIVE 08/09/2016 2339   BILIRUBINUR NEGATIVE 08/09/2016 2339   KETONESUR NEGATIVE 08/09/2016 2339   PROTEINUR NEGATIVE 08/09/2016 2339   NITRITE NEGATIVE 08/09/2016 2339   LEUKOCYTESUR NEGATIVE 08/09/2016 2339   Sepsis Labs Invalid input(s): PROCALCITONIN,  WBC,  LACTICIDVEN Microbiology Recent Results (from the past 240 hour(s))  Blood Culture (routine x 2)     Status: None (Preliminary result)   Collection Time: 08/09/16 10:14 PM  Result Value Ref Range Status   Specimen Description BLOOD RIGHT HAND  Final   Special Requests IN PEDIATRIC BOTTLE Blood Culture adequate volume  Final   Culture NO GROWTH 4 DAYS  Final   Report Status PENDING  Incomplete  Blood Culture (routine x 2)     Status: None (Preliminary result)   Collection Time: 08/09/16 10:14 PM  Result Value Ref Range Status   Specimen Description BLOOD LEFT ANTECUBITAL  Final   Special Requests IN PEDIATRIC BOTTLE Blood Culture adequate volume  Final   Culture NO GROWTH 4 DAYS  Final   Report Status PENDING  Incomplete  Urine culture     Status: Abnormal   Collection Time: 08/09/16 11:40 PM  Result Value Ref Range Status   Specimen Description URINE, RANDOM  Final   Special Requests NONE  Final   Culture 40,000 COLONIES/mL ENTEROCOCCUS FAECALIS (A)  Final   Report Status 08/12/2016 FINAL  Final   Organism ID, Bacteria ENTEROCOCCUS FAECALIS (A)  Final      Susceptibility   Enterococcus faecalis - MIC*    AMPICILLIN <=2 SENSITIVE Sensitive     LEVOFLOXACIN 1 SENSITIVE Sensitive     NITROFURANTOIN <=16 SENSITIVE Sensitive     VANCOMYCIN 2 SENSITIVE Sensitive     * 40,000 COLONIES/mL ENTEROCOCCUS FAECALIS      Time coordinating discharge: Over 30 minutes  SIGNED:   Kathlen Mody, MD  [facility] 08/14/2016, 9:23 AM Pager   If 7PM-7AM, please contact night-coverage [email or web address] Password [email or web address]

## 2017-03-27 DIAGNOSIS — Z8614 Personal history of Methicillin resistant Staphylococcus aureus infection: Secondary | ICD-10-CM | POA: Insufficient documentation

## 2017-07-27 DIAGNOSIS — R7303 Prediabetes: Secondary | ICD-10-CM | POA: Insufficient documentation

## 2018-02-22 ENCOUNTER — Other Ambulatory Visit: Payer: Self-pay

## 2018-02-22 ENCOUNTER — Encounter (HOSPITAL_COMMUNITY)
Admission: AD | Disposition: A | Discharge status: Home / Self Care | Payer: Self-pay | Source: Home / Self Care | Attending: Urology

## 2018-02-22 ENCOUNTER — Other Ambulatory Visit: Payer: Self-pay | Admitting: Urology

## 2018-02-22 ENCOUNTER — Ambulatory Visit (HOSPITAL_COMMUNITY)
Admission: AD | Admit: 2018-02-22 | Discharge: 2018-02-22 | Disposition: A | Discharge status: Home / Self Care | Payer: Worker's Compensation | Attending: Urology | Admitting: Urology

## 2018-02-22 ENCOUNTER — Ambulatory Visit (HOSPITAL_COMMUNITY): Payer: Worker's Compensation | Admitting: Certified Registered Nurse Anesthetist

## 2018-02-22 ENCOUNTER — Ambulatory Visit (HOSPITAL_COMMUNITY): Payer: Worker's Compensation

## 2018-02-22 ENCOUNTER — Encounter (HOSPITAL_COMMUNITY): Payer: Self-pay | Admitting: *Deleted

## 2018-02-22 DIAGNOSIS — F329 Major depressive disorder, single episode, unspecified: Secondary | ICD-10-CM | POA: Diagnosis not present

## 2018-02-22 DIAGNOSIS — Z79891 Long term (current) use of opiate analgesic: Secondary | ICD-10-CM | POA: Insufficient documentation

## 2018-02-22 DIAGNOSIS — Z79899 Other long term (current) drug therapy: Secondary | ICD-10-CM | POA: Insufficient documentation

## 2018-02-22 DIAGNOSIS — Z8249 Family history of ischemic heart disease and other diseases of the circulatory system: Secondary | ICD-10-CM | POA: Diagnosis not present

## 2018-02-22 DIAGNOSIS — Z7982 Long term (current) use of aspirin: Secondary | ICD-10-CM | POA: Diagnosis not present

## 2018-02-22 DIAGNOSIS — K219 Gastro-esophageal reflux disease without esophagitis: Secondary | ICD-10-CM | POA: Diagnosis not present

## 2018-02-22 DIAGNOSIS — N201 Calculus of ureter: Secondary | ICD-10-CM | POA: Insufficient documentation

## 2018-02-22 HISTORY — PX: CYSTOSCOPY W/ URETERAL STENT PLACEMENT: SHX1429

## 2018-02-22 LAB — COMPREHENSIVE METABOLIC PANEL
ALT: 74 U/L — ABNORMAL HIGH (ref 0–44)
AST: 64 U/L — ABNORMAL HIGH (ref 15–41)
Albumin: 3.4 g/dL — ABNORMAL LOW (ref 3.5–5.0)
Alkaline Phosphatase: 201 U/L — ABNORMAL HIGH (ref 38–126)
Anion gap: 11 (ref 5–15)
BUN: 9 mg/dL (ref 8–23)
CO2: 24 mmol/L (ref 22–32)
Calcium: 8.9 mg/dL (ref 8.9–10.3)
Chloride: 93 mmol/L — ABNORMAL LOW (ref 98–111)
Creatinine, Ser: 1 mg/dL (ref 0.61–1.24)
GFR calc Af Amer: >60 mL/min (ref >60)
GFR calc non Af Amer: >60 mL/min (ref >60)
Glucose, Bld: 104 mg/dL — ABNORMAL HIGH (ref 70–99)
Potassium: 4.5 mmol/L (ref 3.5–5.1)
Sodium: 128 mmol/L — ABNORMAL LOW (ref 135–145)
Total Bilirubin: 0.9 mg/dL (ref 0.3–1.2)
Total Protein: 7.6 g/dL (ref 6.5–8.1)

## 2018-02-22 LAB — CBC
HCT: 32.8 % — ABNORMAL LOW (ref 39.0–52.0)
Hemoglobin: 10.8 g/dL — ABNORMAL LOW (ref 13.0–17.0)
MCH: 29.9 pg (ref 26.0–34.0)
MCHC: 32.9 g/dL (ref 30.0–36.0)
MCV: 90.9 fL (ref 80.0–100.0)
Platelets: 397 10*3/uL (ref 150–400)
RBC: 3.61 MIL/uL — ABNORMAL LOW (ref 4.22–5.81)
RDW: 13.5 % (ref 11.5–15.5)
WBC: 18.1 10*3/uL — ABNORMAL HIGH (ref 4.0–10.5)
nRBC: 0 % (ref 0.0–0.2)

## 2018-02-22 SURGERY — CYSTOSCOPY, FLEXIBLE, WITH STENT REPLACEMENT
Anesthesia: General | Laterality: Right

## 2018-02-22 MED ORDER — PHENYLEPHRINE 40 MCG/ML (10ML) SYRINGE FOR IV PUSH (FOR BLOOD PRESSURE SUPPORT)
PREFILLED_SYRINGE | INTRAVENOUS | Status: AC
  Filled 2018-02-22: qty 20

## 2018-02-22 MED ORDER — PROPOFOL 10 MG/ML IV BOLUS
INTRAVENOUS | Status: DC | PRN
  Administered 2018-02-22: 120 mg via INTRAVENOUS

## 2018-02-22 MED ORDER — PROPOFOL 10 MG/ML IV BOLUS
INTRAVENOUS | Status: AC
  Filled 2018-02-22: qty 20

## 2018-02-22 MED ORDER — PHENYLEPHRINE 40 MCG/ML (10ML) SYRINGE FOR IV PUSH (FOR BLOOD PRESSURE SUPPORT)
PREFILLED_SYRINGE | INTRAVENOUS | Status: AC
  Filled 2018-02-22: qty 10

## 2018-02-22 MED ORDER — LIDOCAINE HCL (CARDIAC) PF 50 MG/5ML IV SOSY
PREFILLED_SYRINGE | INTRAVENOUS | Status: DC | PRN
  Administered 2018-02-22: 75 mg via INTRAVENOUS

## 2018-02-22 MED ORDER — SULFAMETHOXAZOLE-TRIMETHOPRIM 800-160 MG PO TABS: 1.0000 | ORAL_TABLET | Freq: Two times a day (BID) | ORAL | 0 refills | Status: DC

## 2018-02-22 MED ORDER — MIDAZOLAM HCL 2 MG/2ML IJ SOLN: 0.5000 mg | Freq: Once | INTRAMUSCULAR | Status: DC | PRN

## 2018-02-22 MED ORDER — MEPERIDINE HCL 50 MG/ML IJ SOLN: 6.2500 mg | INTRAMUSCULAR | Status: DC | PRN

## 2018-02-22 MED ORDER — DEXAMETHASONE SODIUM PHOSPHATE 10 MG/ML IJ SOLN
INTRAMUSCULAR | Status: AC
  Filled 2018-02-22: qty 1

## 2018-02-22 MED ORDER — HYDROMORPHONE HCL 1 MG/ML IJ SOLN: 0.2500 mg | INTRAMUSCULAR | Status: DC | PRN

## 2018-02-22 MED ORDER — KETOROLAC TROMETHAMINE 30 MG/ML IJ SOLN
INTRAMUSCULAR | Status: AC
  Filled 2018-02-22: qty 1

## 2018-02-22 MED ORDER — LIDOCAINE HCL URETHRAL/MUCOSAL 2 % EX GEL
CUTANEOUS | Status: AC
  Filled 2018-02-22: qty 5

## 2018-02-22 MED ORDER — LACTATED RINGERS IV SOLN
INTRAVENOUS | Status: DC | PRN
  Administered 2018-02-22: 15:00:00 via INTRAVENOUS

## 2018-02-22 MED ORDER — LIDOCAINE 2% (20 MG/ML) 5 ML SYRINGE
INTRAMUSCULAR | Status: AC
  Filled 2018-02-22: qty 5

## 2018-02-22 MED ORDER — ONDANSETRON HCL 4 MG/2ML IJ SOLN
INTRAMUSCULAR | Status: AC
  Filled 2018-02-22: qty 2

## 2018-02-22 MED ORDER — PROMETHAZINE HCL 25 MG/ML IJ SOLN: 6.2500 mg | INTRAMUSCULAR | Status: DC | PRN

## 2018-02-22 MED ORDER — 0.9 % SODIUM CHLORIDE (POUR BTL) OPTIME
TOPICAL | Status: DC | PRN
  Administered 2018-02-22: 500 mL

## 2018-02-22 MED ORDER — IOHEXOL 300 MG/ML  SOLN
INTRAMUSCULAR | Status: DC | PRN
  Administered 2018-02-22: 10 mL

## 2018-02-22 MED ORDER — FENTANYL CITRATE (PF) 100 MCG/2ML IJ SOLN
INTRAMUSCULAR | Status: DC | PRN
  Administered 2018-02-22 (×2): 50 ug via INTRAVENOUS

## 2018-02-22 MED ORDER — MIDAZOLAM HCL 2 MG/2ML IJ SOLN
INTRAMUSCULAR | Status: AC
  Filled 2018-02-22: qty 2

## 2018-02-22 MED ORDER — MIDAZOLAM HCL 5 MG/5ML IJ SOLN
INTRAMUSCULAR | Status: DC | PRN
  Administered 2018-02-22: 1 mg via INTRAVENOUS

## 2018-02-22 MED ORDER — SODIUM CHLORIDE 0.9 % IR SOLN
Status: DC | PRN
  Administered 2018-02-22: 3000 mL

## 2018-02-22 MED ORDER — GENTAMICIN SULFATE 40 MG/ML IJ SOLN
5.0000 mg/kg | INTRAVENOUS | Status: AC
  Administered 2018-02-22: 390 mg via INTRAVENOUS
  Filled 2018-02-22: qty 9.75

## 2018-02-22 MED ORDER — CEFAZOLIN SODIUM-DEXTROSE 2-4 GM/100ML-% IV SOLN
2.0000 g | INTRAVENOUS | Status: DC
  Filled 2018-02-22: qty 100

## 2018-02-22 MED ORDER — FENTANYL CITRATE (PF) 250 MCG/5ML IJ SOLN
INTRAMUSCULAR | Status: AC
  Filled 2018-02-22: qty 5

## 2018-02-22 MED ORDER — STERILE WATER FOR IRRIGATION IR SOLN
Status: DC | PRN
  Administered 2018-02-22: 500 mL

## 2018-02-22 MED ORDER — ONDANSETRON HCL 4 MG/2ML IJ SOLN
INTRAMUSCULAR | Status: DC | PRN
  Administered 2018-02-22: 4 mg via INTRAVENOUS

## 2018-02-22 MED ORDER — LACTATED RINGERS IV SOLN
INTRAVENOUS | Status: DC
  Administered 2018-02-22: 15:00:00 via INTRAVENOUS

## 2018-02-22 MED ORDER — SUGAMMADEX SODIUM 500 MG/5ML IV SOLN
INTRAVENOUS | Status: AC
  Filled 2018-02-22: qty 5

## 2018-02-22 MED ORDER — SUGAMMADEX SODIUM 200 MG/2ML IV SOLN
INTRAVENOUS | Status: AC
  Filled 2018-02-22: qty 6

## 2018-02-22 SURGICAL SUPPLY — 17 items
BAG URINE DRAINAGE (UROLOGICAL SUPPLIES) ×3 IMPLANT
BAG URO CATCHER STRL LF (MISCELLANEOUS) ×3 IMPLANT
CATH FOLEY 2WAY SLVR  5CC 16FR (CATHETERS) ×2
CATH FOLEY 2WAY SLVR 5CC 16FR (CATHETERS) ×1 IMPLANT
CATH INTERMIT  6FR 70CM (CATHETERS) ×3 IMPLANT
CLOTH BEACON ORANGE TIMEOUT ST (SAFETY) ×3 IMPLANT
COVER WAND RF STERILE (DRAPES) IMPLANT
GLOVE BIOGEL M STRL SZ7.5 (GLOVE) ×3 IMPLANT
GOWN STRL REUS W/TWL LRG LVL3 (GOWN DISPOSABLE) ×6 IMPLANT
GUIDEWIRE STR DUAL SENSOR (WIRE) ×3 IMPLANT
HOLDER FOLEY CATH W/STRAP (MISCELLANEOUS) ×3 IMPLANT
MANIFOLD NEPTUNE II (INSTRUMENTS) ×3 IMPLANT
PACK CYSTO (CUSTOM PROCEDURE TRAY) ×3 IMPLANT
STENT CONTOUR 6FRX26X.038 (STENTS) ×3 IMPLANT
SYR 10ML LL (SYRINGE) ×3 IMPLANT
TUBING CONNECTING 10 (TUBING) ×2 IMPLANT
TUBING CONNECTING 10' (TUBING) ×1

## 2018-02-22 NOTE — Discharge Instructions (Signed)
I have reviewed discharge instructions in detail with the patient. They will follow-up with me or their physician as scheduled. My nurse will also be calling the patients as per protocol.   

## 2018-02-22 NOTE — Anesthesia Postprocedure Evaluation (Signed)
Anesthesia Post Note  Patient: Robert Mays  Procedure(s) Performed: CYSTOSCOPY, RIGHT RETROGRADE PYELOGRAM WITH URETERAL STENT PLACEMENT (Right )     Patient location during evaluation: PACU Anesthesia Type: General Level of consciousness: awake and alert, patient cooperative and oriented (conversant) Pain management: pain level controlled Vital Signs Assessment: post-procedure vital signs reviewed and stable Respiratory status: spontaneous breathing, nonlabored ventilation and respiratory function stable Cardiovascular status: blood pressure returned to baseline and stable Postop Assessment: no apparent nausea or vomiting Anesthetic complications: no    Last Vitals:  Vitals:   02/22/18 1645 02/22/18 1653  BP: 131/71   Pulse: 75   Resp: 15   Temp:    SpO2: 100% 100%    Last Pain:  Vitals:   02/22/18 1639  TempSrc:   PainSc: 0-No pain                 Denita Lun,E. Jenayah Antu

## 2018-02-22 NOTE — Anesthesia Procedure Notes (Signed)
Procedure Name: LMA Insertion Date/Time: 02/22/2018 4:02 PM Performed by: Illene Silver, CRNA Pre-anesthesia Checklist: Patient identified, Emergency Drugs available, Suction available and Patient being monitored Patient Re-evaluated:Patient Re-evaluated prior to induction Oxygen Delivery Method: Circle system utilized Preoxygenation: Pre-oxygenation with 100% oxygen Induction Type: IV induction Ventilation: Mask ventilation without difficulty LMA: LMA with gastric port inserted LMA Size: 4.0 Tube type: Oral Number of attempts: 1 Airway Equipment and Method: Stylet and Oral airway Placement Confirmation: ETT inserted through vocal cords under direct vision,  positive ETCO2 and breath sounds checked- equal and bilateral Tube secured with: Tape Dental Injury: Teeth and Oropharynx as per pre-operative assessment

## 2018-02-22 NOTE — Op Note (Signed)
Preoperative diagnosis: Right ureteral stone and possible urinary tract infection Postoperative diagnosis: Right ureteral stone and possible urinary tract infection Surgery: Cystoscopy right retrograde pyelogram and insertion of right ureteral stent Surgeon: Dr. Lorin Picket Bettejane Leavens  The patient has the above diagnosis and consent of the above procedure.  He has a lot of rigidity in the pelvis and extra care was taken with leg positioning.  Preoperative antibiotics were given.  24 French cystoscope was utilized.  Penile bulbar membranous urethra normal.  He bilobar enlargement the prostate.  He had grade 1 out of 4 bladder trabeculation.  He had minimal erythema from his Foley catheter.  Ureteral orifices were easily identified  Under cystoscopic and fluoroscopic guidance I easily passed a sensor wire to the level of the sacroiliac joint upper aspect.  I then passed a 6 [place] open-ended ureteral catheter removing the wire.  I did a gentle retrograde in the face of infection.  I injected approximately 3 cc  Retrograde urogram: 3 to 5 cc of contrast was instilled.  He had dilation of the renal pelvis and calyces mild to moderate.  Stone was easily identified in the mid upper ureter  Under fluoroscopic guidance I passed a sensor wire curling in the upper pole calyx.  I remove the open-ended ureteral catheter  Over the wire I passed a 6 x 26 cm double-J stent.  He is 5 foot 6 and I chose the longer length.  I was careful in inserting the stent.  He had a nice curl in the bladder but it curled almost twice in the renal pelvis.  It was in good position.  There was a moderate volcano effect in keeping with possible urinary tract infection  The patient will be followed as per protocol.  He will follow-up with 1 of my stone partners.  Hopefully he will reach his treatment goal.  He just finished antibiotic and I gave him loading doses.  I thought it was reasonable to send him home with a broad-spectrum  antibiotic cultures pending.

## 2018-02-22 NOTE — Anesthesia Preprocedure Evaluation (Addendum)
Anesthesia Evaluation  Patient identified by MRN, date of birth, ID band Patient awake    Reviewed: Allergy & Precautions, NPO status , Patient's Chart, lab work & pertinent test results  History of Anesthesia Complications Negative for: history of anesthetic complications  Airway Mallampati: II  TM Distance: >3 FB Neck ROM: Full    Dental  (+) Edentulous Upper, Edentulous Lower   Pulmonary neg pulmonary ROS,    breath sounds clear to auscultation       Cardiovascular negative cardio ROS   Rhythm:Regular Rate:Normal     Neuro/Psych  Headaches, Depression    GI/Hepatic Neg liver ROS, GERD  Controlled,  Endo/Other  negative endocrine ROS  Renal/GU negative Renal ROS     Musculoskeletal   Abdominal   Peds  Hematology negative hematology ROS (+)   Anesthesia Other Findings   Reproductive/Obstetrics                            Anesthesia Physical Anesthesia Plan  ASA: II  Anesthesia Plan: General   Post-op Pain Management:    Induction: Intravenous  PONV Risk Score and Plan: 2 and Ondansetron and Dexamethasone  Airway Management Planned: LMA  Additional Equipment:   Intra-op Plan:   Post-operative Plan:   Informed Consent: I have reviewed the patients History and Physical, chart, labs and discussed the procedure including the risks, benefits and alternatives for the proposed anesthesia with the patient or authorized representative who has indicated his/her understanding and acceptance.       Plan Discussed with: CRNA and Surgeon  Anesthesia Plan Comments:         Anesthesia Quick Evaluation

## 2018-02-22 NOTE — H&P (Signed)
I reviewed a lengthy note last time. Patient has an indwelling Foley catheter. Last urine culture is negative. The patient recently went to the hospital approximately 8 days ago with left-sided pain. He was found to have a 5 x 7 mm stone in the proximal right ureter and nonobstructing stones in the right kidney. He was afebrile at the time. Serum creatinine was 0.7. I did not see a urine culture. Patient had a normal white blood count. He had presented with bilateral abdominal pain with acute onset   Last week the patient had some right-sided pain and went to the emergency room. His temperature 2 days ago is 100.4 was 99.2 yesterday. It is 99 today. The right-sided pain went away since last Wednesday. He has a catheter in place and may have some diffuse lower abdominal discomfort but he was very vague and not complaining much of discomfort now.   He has been noted to have smaller nonobstructing stones in both kidneys on past ultrasounds and even a 5 mm stone in the bladder.   There is no other aggravating or relieving factors  There is no other associated signs and symptoms  The severity of the symptoms is moderate  The symptoms are ongoing and bothersome   I reviewed the KUB. There was quite a bit of stool and gas over the kidneys. No bony abnormalities. No calcification in the pelvis. I believe I can see the 7 x 5 mm stone as a vague calcified shadow at the level of L4 spinous process on the right   a picture was drawn. The patient has a right ureteral stone. I am little bit concerned of his temperature that goes up and down. He certainly looks good today and was not toxic. He had no CVA tenderness. Urine was sent for culture though it may be colonized. He does have moderate stone burden in total of the right kidney. He also may be less ideal for IV sedation and lithotripsy versus intubation. I think could be very reasonable to manage his stone with ureteroscopy but also a manage the other stones in  the right kidney.   I discussed the case with Dr. Alvester Morin. Even though the patient looks great I think it is safest to put in a stent. They understand after drying a picture he could have a low-grade infection behind the stone. He just finished ciprofloxacin yesterday. Pros cons risks described. Sequelae described. Injury and percutaneous procedures described. Stent side effects described. In the future he would either have lithotripsy or ureteroscopy as noted. I think this is safe is on a Friday. He has not eaten.   The patient's stone is highly likely due to his immobilization related to his injury. It is common in these cases to have bone leaching of calcium and associated dehydration with chronic illness predisposing to stones. In my opinion it is more likely than not that the stone is secondary to his immobilization and injury.      ALLERGIES: None   MEDICATIONS: Aspirin  Lipitor  Macrodantin 100 mg capsule 1 capsule PO Daily  Oxybutynin Chloride Er 10 mg tablet, extended release 24 hr 1 tablet PO Daily  Tamsulosin Hcl 0.4 mg capsule TAKE 2 CAPSULES BY MOUTH ONCE DAILY  Amitiza  Amitriptyline Hcl  Baclofen 10 mg tablet Oral  Bentyl  Clonidine  Effexor Xr 75 mg capsule, ext release 24 hr Oral  Morphine Sulfate  Narcan  Neurontin  Nucynta  Nucynta  Omeprazole 20 mg capsule,delayed release Oral  Oxycodone Hcl Er  Oxycontin  Trileptal 300 mg tablet Oral  Zofran     GU PSH: Complex cystometrogram, w/ void pressure and urethral pressure profile studies, any technique - 11/24/2015 Complex Uroflow - 11/24/2015 Cystoscopy - 2016-05-28 Emg surf Electrd - 11/24/2015 Inject For cystogram - 11/24/2015 Intrabd voidng Press - 11/24/2015    NON-GU PSH: Back surgery     GU PMH: Suprapubic pain (Worsening, Chronic), Culture cath UA. No ABX unless pt becomes symptomatic with temp >100.5, gross hematuria, or altered mental status. Recommend he begin daily Cranberry 1 po TID, Probiotic 1 po daily, and  flush catheter TID with acetic acid 0.25%. Will upsize catheter today to 62 F per pt and wife request - 07/11/2017 Weak Urinary Stream - 02/26/2017 Incomplete bladder emptying - 12/27/2016 Acute Cystitis/UTI - 12/05/2016 Bladder, Neuromuscular dysfunction, Unspec, Neurogenic bladder - 05-29-2014 Urge incontinence, Urge incontinence of urine - May 29, 2014 Renal calculus, Nephrolithiasis - 05-28-13 ED due to arterial insufficiency, Erectile dysfunction due to arterial insufficiency - May 28, 2012 Nocturia, Nocturia - 2014 History of urolithiasis Low back pain     PMH Notes: .   NON-GU PMH: Encounter for general adult medical examination without abnormal findings, Encounter for preventive health examination - 2015-05-29    FAMILY HISTORY: Deceased - Father Family Health Status Number - Runs In Family Heart Disease - Runs In Family Hypertension - Runs In Family nephrolithiasis - Runs In Family Renal Disease - Runs In Family   SOCIAL HISTORY: Marital Status: Married Preferred Language: English; Ethnicity: Not Hispanic Or Latino; Race: White    REVIEW OF SYSTEMS:    GU Review Male:  Patient denies frequent urination, hard to postpone urination, burning/ pain with urination, get up at night to urinate, leakage of urine, stream starts and stops, trouble starting your stream, have to strain to urinate , erection problems, and penile pain.  Gastrointestinal (Upper):  Patient denies nausea, vomiting, and indigestion/ heartburn.  Gastrointestinal (Lower):  Patient denies diarrhea and constipation.  Constitutional:  Patient reports fever. Patient denies night sweats, weight loss, and fatigue.  Skin:  Patient denies skin rash/ lesion and itching.  Eyes:  Patient denies blurred vision and double vision.  Ears/ Nose/ Throat:  Patient denies sore throat and sinus problems.  Hematologic/Lymphatic:  Patient denies swollen glands and easy bruising.  Cardiovascular:  Patient denies leg swelling and chest pains.  Respiratory:   Patient denies cough and shortness of breath.  Endocrine:  Patient denies excessive thirst.  Musculoskeletal:  Patient denies back pain and joint pain.  Neurological:  Patient denies headaches and dizziness.  Psychologic:  Patient denies depression and anxiety.   VITAL SIGNS:     02/22/2018 08:14 AM  Weight 215.0 lb / 97.52 kg  Height 65 in / 165.1 cm  BP 115/71 mmHg  Pulse 102 /min  Temperature 99.0 F / 37.2 C  BMI 35.8 kg/m   PAST DATA REVIEWED:   Source Of History:  Patient   PROCEDURES:    KUB - [id number]  I reviewed the KUB. There was quite a bit of stool and gas over the kidneys. No bony abnormalities. No calcification in the pelvis. I believe I can see the 7 x 5 mm stone as a vague calcified shadow at the level of L4 spinous process on the right   After a thorough review of the management options for the patient's condition the patient  elected to proceed with surgical therapy as noted above. We have discussed the potential benefits and risks of the  procedure, side effects of the proposed treatment, the likelihood of the patient achieving the goals of the procedure, and any potential problems that might occur during the procedure or recuperation. Informed consent has been obtained.

## 2018-02-22 NOTE — Transfer of Care (Addendum)
Immediate Anesthesia Transfer of Care Note  Patient: Robert Mays  Procedure(s) Performed: CYSTOSCOPY, RIGHT RETROGRADE PYELOGRAM WITH URETERAL STENT PLACEMENT (Right )  Patient Location: PACU  Anesthesia Type:General  Level of Consciousness: awake, alert , oriented and patient cooperative  Airway & Oxygen Therapy: Patient Spontanous Breathing and Patient connected to face mask oxygen  Post-op Assessment: Report given to RN and Post -op Vital signs reviewed and stable  Post vital signs: stable  Last Vitals:  Vitals Value Taken Time  BP 120/70 02/22/2018  4:39 PM  Temp 36.9 C 02/22/2018  4:39 PM  Pulse 75 02/22/2018  4:44 PM  Resp 15 02/22/2018  4:44 PM  SpO2 100 % 02/22/2018  4:44 PM  Vitals shown include unvalidated device data.  Last Pain:  Vitals:   02/22/18 1450  TempSrc:   PainSc: 7       Patients Stated Pain Goal: 4 (02/22/18 1450)  Complications: No apparent anesthesia complications

## 2018-02-22 NOTE — Interval H&P Note (Signed)
History and Physical Interval Note:  02/22/2018 3:33 PM  Robert Mays  has presented today for surgery, with the diagnosis of right ureteral stone  The various methods of treatment have been discussed with the patient and family. After consideration of risks, benefits and other options for treatment, the patient has consented to  Procedure(s): CYSTOSCOPY WITH STENT REPLACEMENT (Right) as a surgical intervention .  The patient's history has been reviewed, patient examined, no change in status, stable for surgery.  I have reviewed the patient's chart and labs.  Questions were answered to the patient's satisfaction.     [name]

## 2018-02-23 ENCOUNTER — Encounter (HOSPITAL_COMMUNITY): Payer: Self-pay | Admitting: Urology

## 2018-03-07 ENCOUNTER — Other Ambulatory Visit: Payer: Self-pay | Admitting: Urology

## 2018-03-13 ENCOUNTER — Encounter (HOSPITAL_BASED_OUTPATIENT_CLINIC_OR_DEPARTMENT_OTHER): Payer: Self-pay | Admitting: *Deleted

## 2018-03-13 ENCOUNTER — Other Ambulatory Visit: Payer: Self-pay

## 2018-03-13 NOTE — Progress Notes (Signed)
Spoke with patients wife via telephone for pre op interview. Patient is paraplegic. Wife Arline Asp will need to come to Pre op with patient DOS. No solids after MN can have clear liquids until 0915 AM of surgery. Wife Arline Asp verbalized understanding of clear liquid diet and NO milk products. Patient to take Baclofen, Prilosec, Oxycodone, Flomax, Effexor, Nycenta, Gabapentin and Trileptal AM of surgery. Arrival time 1315.

## 2018-03-18 ENCOUNTER — Encounter (HOSPITAL_BASED_OUTPATIENT_CLINIC_OR_DEPARTMENT_OTHER): Payer: Self-pay | Admitting: Anesthesiology

## 2018-03-18 ENCOUNTER — Ambulatory Visit (HOSPITAL_BASED_OUTPATIENT_CLINIC_OR_DEPARTMENT_OTHER): Payer: Worker's Compensation | Admitting: Anesthesiology

## 2018-03-18 ENCOUNTER — Encounter (HOSPITAL_BASED_OUTPATIENT_CLINIC_OR_DEPARTMENT_OTHER)
Admission: RE | Disposition: A | Discharge status: Home / Self Care | Payer: Self-pay | Source: Home / Self Care | Attending: Urology

## 2018-03-18 ENCOUNTER — Ambulatory Visit (HOSPITAL_BASED_OUTPATIENT_CLINIC_OR_DEPARTMENT_OTHER)
Admission: RE | Admit: 2018-03-18 | Discharge: 2018-03-18 | Disposition: A | Discharge status: Home / Self Care | Payer: Worker's Compensation | Attending: Urology | Admitting: Urology

## 2018-03-18 DIAGNOSIS — Z79899 Other long term (current) drug therapy: Secondary | ICD-10-CM | POA: Insufficient documentation

## 2018-03-18 DIAGNOSIS — F329 Major depressive disorder, single episode, unspecified: Secondary | ICD-10-CM | POA: Insufficient documentation

## 2018-03-18 DIAGNOSIS — M549 Dorsalgia, unspecified: Secondary | ICD-10-CM | POA: Insufficient documentation

## 2018-03-18 DIAGNOSIS — K219 Gastro-esophageal reflux disease without esophagitis: Secondary | ICD-10-CM | POA: Insufficient documentation

## 2018-03-18 DIAGNOSIS — N201 Calculus of ureter: Secondary | ICD-10-CM | POA: Diagnosis present

## 2018-03-18 DIAGNOSIS — Z6832 Body mass index (BMI) 32.0-32.9, adult: Secondary | ICD-10-CM | POA: Diagnosis not present

## 2018-03-18 DIAGNOSIS — Z7982 Long term (current) use of aspirin: Secondary | ICD-10-CM | POA: Diagnosis not present

## 2018-03-18 DIAGNOSIS — N202 Calculus of kidney with calculus of ureter: Secondary | ICD-10-CM | POA: Insufficient documentation

## 2018-03-18 DIAGNOSIS — Z993 Dependence on wheelchair: Secondary | ICD-10-CM | POA: Diagnosis not present

## 2018-03-18 DIAGNOSIS — Z87440 Personal history of urinary (tract) infections: Secondary | ICD-10-CM | POA: Diagnosis not present

## 2018-03-18 DIAGNOSIS — G8929 Other chronic pain: Secondary | ICD-10-CM | POA: Insufficient documentation

## 2018-03-18 DIAGNOSIS — G834 Cauda equina syndrome: Secondary | ICD-10-CM | POA: Diagnosis not present

## 2018-03-18 DIAGNOSIS — F1120 Opioid dependence, uncomplicated: Secondary | ICD-10-CM | POA: Insufficient documentation

## 2018-03-18 DIAGNOSIS — R51 Headache: Secondary | ICD-10-CM | POA: Diagnosis not present

## 2018-03-18 HISTORY — PX: CYSTOSCOPY/URETEROSCOPY/HOLMIUM LASER/STENT PLACEMENT: SHX6546

## 2018-03-18 SURGERY — CYSTOSCOPY/URETEROSCOPY/HOLMIUM LASER/STENT PLACEMENT
Anesthesia: General | Laterality: Right

## 2018-03-18 MED ORDER — SODIUM CHLORIDE 0.9 % IV SOLN
1.0000 g | Freq: Once | INTRAVENOUS | Status: AC
  Administered 2018-03-18: 1 g via INTRAVENOUS
  Filled 2018-03-18: qty 1

## 2018-03-18 MED ORDER — FLUCONAZOLE 200 MG PO TABS: 200.0000 mg | ORAL_TABLET | Freq: Every day | ORAL | 0 refills | Status: AC

## 2018-03-18 MED ORDER — FENTANYL CITRATE (PF) 100 MCG/2ML IJ SOLN
25.0000 ug | INTRAMUSCULAR | Status: DC | PRN
  Filled 2018-03-18: qty 1

## 2018-03-18 MED ORDER — DEXAMETHASONE SODIUM PHOSPHATE 10 MG/ML IJ SOLN
INTRAMUSCULAR | Status: AC
  Filled 2018-03-18: qty 1

## 2018-03-18 MED ORDER — FENTANYL CITRATE (PF) 100 MCG/2ML IJ SOLN
INTRAMUSCULAR | Status: AC
  Filled 2018-03-18: qty 2

## 2018-03-18 MED ORDER — FENTANYL CITRATE (PF) 100 MCG/2ML IJ SOLN
INTRAMUSCULAR | Status: DC | PRN
  Administered 2018-03-18: 25 ug via INTRAVENOUS

## 2018-03-18 MED ORDER — MIDAZOLAM HCL 2 MG/2ML IJ SOLN
0.5000 mg | Freq: Once | INTRAMUSCULAR | Status: DC | PRN
  Filled 2018-03-18: qty 2

## 2018-03-18 MED ORDER — PROPOFOL 10 MG/ML IV BOLUS
INTRAVENOUS | Status: DC | PRN
  Administered 2018-03-18: 140 mg via INTRAVENOUS

## 2018-03-18 MED ORDER — PROPOFOL 10 MG/ML IV BOLUS
INTRAVENOUS | Status: AC
  Filled 2018-03-18: qty 20

## 2018-03-18 MED ORDER — ONDANSETRON HCL 4 MG/2ML IJ SOLN
INTRAMUSCULAR | Status: DC | PRN
  Administered 2018-03-18: 4 mg via INTRAVENOUS

## 2018-03-18 MED ORDER — LIDOCAINE HCL (CARDIAC) PF 100 MG/5ML IV SOSY
PREFILLED_SYRINGE | INTRAVENOUS | Status: DC | PRN
  Administered 2018-03-18: 40 mg via INTRAVENOUS

## 2018-03-18 MED ORDER — ONDANSETRON HCL 4 MG/2ML IJ SOLN
INTRAMUSCULAR | Status: AC
  Filled 2018-03-18: qty 2

## 2018-03-18 MED ORDER — EPHEDRINE SULFATE 50 MG/ML IJ SOLN
INTRAMUSCULAR | Status: DC | PRN
  Administered 2018-03-18 (×4): 10 mg via INTRAVENOUS

## 2018-03-18 MED ORDER — PROMETHAZINE HCL 25 MG/ML IJ SOLN
6.2500 mg | INTRAMUSCULAR | Status: DC | PRN
  Filled 2018-03-18: qty 1

## 2018-03-18 MED ORDER — CIPROFLOXACIN HCL 500 MG PO TABS: 500.0000 mg | ORAL_TABLET | Freq: Two times a day (BID) | ORAL | 0 refills | Status: AC

## 2018-03-18 MED ORDER — MIDAZOLAM HCL 5 MG/5ML IJ SOLN
INTRAMUSCULAR | Status: DC | PRN
  Administered 2018-03-18 (×2): 1 mg via INTRAVENOUS

## 2018-03-18 MED ORDER — MEPERIDINE HCL 25 MG/ML IJ SOLN
6.2500 mg | INTRAMUSCULAR | Status: DC | PRN
  Filled 2018-03-18: qty 1

## 2018-03-18 MED ORDER — KETOROLAC TROMETHAMINE 30 MG/ML IJ SOLN
INTRAMUSCULAR | Status: AC
  Filled 2018-03-18: qty 1

## 2018-03-18 MED ORDER — MIDAZOLAM HCL 2 MG/2ML IJ SOLN
INTRAMUSCULAR | Status: AC
  Filled 2018-03-18: qty 2

## 2018-03-18 MED ORDER — LIDOCAINE 2% (20 MG/ML) 5 ML SYRINGE
INTRAMUSCULAR | Status: AC
  Filled 2018-03-18: qty 5

## 2018-03-18 MED ORDER — LACTATED RINGERS IV SOLN
INTRAVENOUS | Status: DC
  Administered 2018-03-18: 15:00:00 via INTRAVENOUS
  Filled 2018-03-18: qty 1000

## 2018-03-18 SURGICAL SUPPLY — 22 items
BAG DRAIN URO-CYSTO SKYTR STRL (DRAIN) ×2 IMPLANT
BASKET ZERO TIP NITINOL 2.4FR (BASKET) ×1 IMPLANT
CATH FOLEY 2WAY SLVR  5CC 18FR (CATHETERS) ×1
CATH FOLEY 2WAY SLVR 5CC 18FR (CATHETERS) IMPLANT
CATH INTERMIT  6FR 70CM (CATHETERS) IMPLANT
CLOTH BEACON ORANGE TIMEOUT ST (SAFETY) ×2 IMPLANT
FIBER LASER FLEXIVA 365 (UROLOGICAL SUPPLIES) IMPLANT
FIBER LASER TRAC TIP (UROLOGICAL SUPPLIES) IMPLANT
GLOVE BIO SURGEON STRL SZ7.5 (GLOVE) ×2 IMPLANT
GOWN STRL REUS W/TWL XL LVL3 (GOWN DISPOSABLE) IMPLANT
GUIDEWIRE STR DUAL SENSOR (WIRE) ×2 IMPLANT
INFUSOR MANOMETER BAG 3000ML (MISCELLANEOUS) ×2 IMPLANT
IV NS 1000ML (IV SOLUTION) ×1
IV NS 1000ML BAXH (IV SOLUTION) ×1 IMPLANT
IV NS IRRIG 3000ML ARTHROMATIC (IV SOLUTION) ×2 IMPLANT
KIT TURNOVER CYSTO (KITS) ×2 IMPLANT
MANIFOLD NEPTUNE II (INSTRUMENTS) ×2 IMPLANT
NS IRRIG 500ML POUR BTL (IV SOLUTION) ×4 IMPLANT
PACK CYSTO (CUSTOM PROCEDURE TRAY) ×2 IMPLANT
STENT URET 6FRX26 CONTOUR (STENTS) ×1 IMPLANT
TUBE CONNECTING 12X1/4 (SUCTIONS) IMPLANT
TUBING UROLOGY SET (TUBING) IMPLANT

## 2018-03-18 NOTE — Transfer of Care (Signed)
Immediate Anesthesia Transfer of Care Note  Patient: Osagie Doskocil  Procedure(s) Performed: CYSTOSCOPY/URETEROSCOPY/HOLMIUM LASER/STENT PLACEMENT (Right )  Patient Location: PACU  Anesthesia Type:General  Level of Consciousness: awake, alert  and oriented  Airway & Oxygen Therapy: Patient Spontanous Breathing and Patient connected to nasal cannula oxygen  Post-op Assessment: Report given to RN and Post -op Vital signs reviewed and stable  Post vital signs: Reviewed and stable  Last Vitals:  Vitals Value Taken Time  BP    Temp    Pulse 85 03/18/2018  4:07 PM  Resp    SpO2 94 % 03/18/2018  4:07 PM  Vitals shown include unvalidated device data.  Last Pain:  Vitals:   03/18/18 1327  TempSrc: Oral         Complications: No apparent anesthesia complications

## 2018-03-18 NOTE — Discharge Instructions (Addendum)
[facility] °[phone] °Post Ureteroscopy With or Without Stent Instructions ° °Definitions: ° °Ureter: The duct that transports urine from the kidney to the bladder. °Stent:   A plastic hollow tube that is placed into the ureter, from the kidney to the                 bladder to prevent the ureter from swelling shut. ° °GENERAL INSTRUCTIONS: ° °Despite the fact that no skin incisions were used, the area around the ureter and bladder is raw and irritated. The stent is a foreign body which will further irritate the bladder wall. This irritation is manifested by increased frequency of urination, both day and night, and by an increase in the urge to urinate. In some, the urge to urinate is present almost always. Sometimes the urge is strong enough that you may not be able to stop yourself from urinating. The only real cure is to remove the stent and then give time for the bladder wall to heal which can't be done until the danger of the ureter swelling shut has passed, which varies. ° °You may see some blood in your urine while the stent is in place and a few days afterwards. Do not be alarmed, even if the urine was clear for a while. Get off your feet and drink lots of fluids until clearing occurs. If you start to pass clots or don't improve, call us. ° °DIET: °You may return to your normal diet immediately. Because of the raw surface of your bladder, alcohol, spicy foods, acid type foods and drinks with caffeine may cause irritation or frequency and should be used in moderation. To keep your urine flowing freely and to avoid constipation, drink plenty of fluids during the day ( 8-10 glasses ). °Tip: Avoid cranberry juice because it is very acidic. ° °ACTIVITY: °Your physical activity doesn't need to be restricted. However, if you are very active, you may see some blood in your urine. We suggest that you reduce your activity under these circumstances until the bleeding has stopped. ° °BOWELS: °It is  important to keep your bowels regular during the postoperative period. Straining with bowel movements can cause bleeding. A bowel movement every other day is reasonable. Use a mild laxative if needed, such as Milk of Magnesia 2-3 tablespoons, or 2 Dulcolax tablets. Call if you continue to have problems. If you have been taking narcotics for pain, before, during or after your surgery, you may be constipated. Take a laxative if necessary. ° ° °MEDICATION: °You should resume your pre-surgery medications unless told not to. °You may take oxybutynin or flomax if prescribed for bladder spasms or discomfort from the stent °Take pain medication as directed for pain refractory to conservative management ° °PROBLEMS YOU SHOULD REPORT TO US: °· Fevers over 100.5 Fahrenheit. °· Heavy bleeding, or clots ( See above notes about blood in urine ). °· Inability to urinate. °· Drug reactions ( hives, rash, nausea, vomiting, diarrhea ). °· Severe burning or pain with urination that is not improving. ° ° °Post Anesthesia Home Care Instructions ° °Activity: °Get plenty of rest for the remainder of the day. A responsible individual must stay with you for 24 hours following the procedure.  °For the next 24 hours, DO NOT: °-Drive a car °-Operate machinery °-Drink alcoholic beverages °-Take any medication unless instructed by your physician °-Make any legal decisions or sign important papers. ° °Meals: °Start with liquid foods such as gelatin or soup. Progress to regular foods   as tolerated. Avoid greasy, spicy, heavy foods. If nausea and/or vomiting occur, drink only clear liquids until the nausea and/or vomiting subsides. Call your physician if vomiting continues. ° °Special Instructions/Symptoms: °Your throat may feel dry or sore from the anesthesia or the breathing tube placed in your throat during surgery. If this causes discomfort, gargle with warm salt water. The discomfort should disappear within 24 hours. ° °If you had a scopolamine  patch placed behind your ear for the management of post- operative nausea and/or vomiting: ° °1. The medication in the patch is effective for 72 hours, after which it should be removed.  Wrap patch in a tissue and discard in the trash. Wash hands thoroughly with soap and water. °2. You may remove the patch earlier than 72 hours if you experience unpleasant side effects which may include dry mouth, dizziness or visual disturbances. °3. Avoid touching the patch. Wash your hands with soap and water after contact with the patch. °  ° ° °

## 2018-03-18 NOTE — Interval H&P Note (Signed)
History and Physical Interval Note:  03/18/2018 2:40 PM  Robert Mays  has presented today for surgery, with the diagnosis of RIGHT URETERAL STONE  The various methods of treatment have been discussed with the patient and family. After consideration of risks, benefits and other options for treatment, the patient has consented to  Procedure(s): CYSTOSCOPY/URETEROSCOPY/HOLMIUM LASER/STENT PLACEMENT (Right) as a surgical intervention .  The patient's history has been reviewed, patient examined, no change in status, stable for surgery.  I have reviewed the patient's chart and labs.  Questions were answered to the patient's satisfaction.     Ray Church, III

## 2018-03-18 NOTE — Anesthesia Postprocedure Evaluation (Signed)
Anesthesia Post Note  Patient: Ratana Flood  Procedure(s) Performed: CYSTOSCOPY/URETEROSCOPY/HOLMIUM LASER/STENT PLACEMENT (Right )     Patient location during evaluation: PACU Anesthesia Type: General Level of consciousness: awake and alert Pain management: pain level controlled Vital Signs Assessment: post-procedure vital signs reviewed and stable Respiratory status: spontaneous breathing, nonlabored ventilation and respiratory function stable Cardiovascular status: blood pressure returned to baseline and stable Postop Assessment: no apparent nausea or vomiting Anesthetic complications: no    Last Vitals:  Vitals:   03/18/18 1630 03/18/18 1640  BP: (!) 154/86   Pulse: 84   Resp: 13   Temp:  36.4 C  SpO2: 98%     Last Pain:  Vitals:   03/18/18 1640  TempSrc:   PainSc: 0-No pain                 Kaylyn Layer

## 2018-03-18 NOTE — H&P (Signed)
CC/HPI: Cc: Right ureteral calculus  HPI:  03/06/2018  Long-time patient of Dr. Sherron Monday. He has a history of chronic indwelling Foley catheter. He is in a wheelchair. He has a history of multidrug resistant organisms. Most recently, the patient had a right ureteral stent placed for an obstructing stone with associated concern for UTI or sepsis. He has been doing well since the ureteral stent placement. No pain. Foley catheter draining well.     ALLERGIES: No Allergies    MEDICATIONS: Aspirin  Lipitor  Macrodantin 100 mg capsule 1 capsule PO Daily  Oxybutynin Chloride Er 10 mg tablet, extended release 24 hr 1 tablet PO Daily  Tamsulosin Hcl 0.4 mg capsule TAKE 2 CAPSULES BY MOUTH ONCE DAILY  Amitiza  Amitriptyline Hcl  Baclofen 10 mg tablet Oral  Bentyl  Clonidine  Effexor Xr 75 mg capsule, ext release 24 hr Oral  Morphine Sulfate  Narcan  Neurontin  Nucynta  Nucynta  Omeprazole 20 mg capsule,delayed release Oral  Oxycodone Hcl Er  Oxycontin  Trileptal 300 mg tablet Oral  Zofran     GU PSH: Complex cystometrogram, w/ void pressure and urethral pressure profile studies, any technique - 11/24/2015 Complex Uroflow - 11/24/2015 Cystoscopy - 05/12/16 Cystoscopy Insert Stent, Right - 02/22/2018 Emg surf Electrd - 11/24/2015 Inject For cystogram - 11/24/2015 Intrabd voidng Press - 11/24/2015    NON-GU PSH: Back surgery    GU PMH: Ureteral calculus - 02/22/2018 Suprapubic pain (Worsening, Chronic), Culture cath UA. No ABX unless pt becomes symptomatic with temp >100.5, gross hematuria, or altered mental status. Recommend he begin daily Cranberry 1 po TID, Probiotic 1 po daily, and flush catheter TID with acetic acid 0.25%. Will upsize catheter today to 44 F per pt and wife request - 07/11/2017 Weak Urinary Stream - 02/26/2017 Incomplete bladder emptying - 12/27/2016 Acute Cystitis/UTI - 12/05/2016 Bladder, Neuromuscular dysfunction, Unspec, Neurogenic bladder - 2014/05/13 Urge incontinence, Urge  incontinence of urine - May 13, 2014 Renal calculus, Nephrolithiasis - 2013/05/12 ED due to arterial insufficiency, Erectile dysfunction due to arterial insufficiency - 05/12/12 Nocturia, Nocturia - 2014 History of urolithiasis Low back pain      PMH Notes: .   NON-GU PMH: Encounter for general adult medical examination without abnormal findings, Encounter for preventive health examination - 05-13-2015    FAMILY HISTORY: Deceased - Father Family Health Status Number - Runs In Family Heart Disease - Runs In Family Hypertension - Runs In Family nephrolithiasis - Runs In Family Renal Disease - Runs In Family   SOCIAL HISTORY: Marital Status: Married Preferred Language: English; Ethnicity: Not Hispanic Or Latino; Race: White    REVIEW OF SYSTEMS:    GU Review Male:   Patient denies frequent urination, hard to postpone urination, burning/ pain with urination, get up at night to urinate, leakage of urine, stream starts and stops, trouble starting your stream, have to strain to urinate , erection problems, and penile pain.  Gastrointestinal (Upper):   Patient denies nausea, vomiting, and indigestion/ heartburn.  Gastrointestinal (Lower):   Patient denies diarrhea and constipation.  Constitutional:   Patient denies fever, night sweats, weight loss, and fatigue.  Skin:   Patient denies skin rash/ lesion and itching.  Eyes:   Patient denies blurred vision and double vision.  Ears/ Nose/ Throat:   Patient denies sore throat and sinus problems.  Hematologic/Lymphatic:   Patient denies swollen glands and easy bruising.  Cardiovascular:   Patient denies leg swelling and chest pains.  Respiratory:   Patient denies cough and shortness  of breath.  Endocrine:   Patient denies excessive thirst.  Musculoskeletal:   Patient denies back pain and joint pain.  Neurological:   Patient denies headaches and dizziness.  Psychologic:   Patient denies depression and anxiety.   VITAL SIGNS:      03/06/2018 02:34 PM  BP 123/67  mmHg  Heart Rate 84 /min  Temperature 98.2 F / 36.7 C   GU PHYSICAL EXAMINATION:    Penis: Penile foley catheter present. Draining clear yellow urine   MULTI-SYSTEM PHYSICAL EXAMINATION:    Constitutional: Well-nourished. No physical deformities. Normally developed. Good grooming.   Respiratory: No labored breathing, no use of accessory muscles.   Cardiovascular: Normal temperature, adequate perfusion of extremities  Skin: No paleness, no jaundice  Neurologic / Psychiatric: Patient anxious. Oriented to time, oriented to place, oriented to person. No depression, no agitation.   Gastrointestinal: No mass, no tenderness, no rigidity, non obese abdomen.   Eyes: Normal conjunctivae. Normal eyelids.  Musculoskeletal: In a wheelchair     PAST DATA REVIEWED:  Source Of History:  Patient  Records Review:   Previous Patient Records  X-Ray Review: C.T. Abdomen/Pelvis: Reviewed Films. Reviewed Report. Discussed With Patient. I reviewed his CT scan from 02/13/2018. Obstructing right ureteral calculus. Nonobstructing right renal calculi.    PROCEDURES:         KUB - [id number]  A single view of the abdomen is obtained.  Right ureteral stent is in a good place. Radiopaque stone in the proximal ureter. Also radiopaque stones in the right lower pole. These correspond to the stone seen on CT scan.         ASSESSMENT:      ICD-10 Details  1 GU:   Renal and ureteral calculus - N20.2   2   Bladder, Neuromuscular dysfunction, Unspec - N31.9   3   Urinary Retention - R33.8    PLAN:           Orders Labs CULTURE, URINE          Document Letter(s):  Created for Patient: Clinical Summary         Notes:   Recommend cystoscopy with right ureteroscopy with laser lithotripsy and ureteral stent exchange. I discussed different complications that can occur including but not limited to bleeding, infection especially in his case with a history of multidrug resistant organisms and chronic indwelling catheter,  injury to surrounding structures including the potential for ureteral avulsion requiring an abdominal repair, potential need for additional procedures. He is eager to proceed.   Cc: Dr. Sherron Monday   Signed by Modena Slater, III, M.D. on 03/06/18 at 2:55 PM (EST

## 2018-03-18 NOTE — Anesthesia Preprocedure Evaluation (Addendum)
Anesthesia Evaluation  Patient identified by MRN, date of birth, ID band Patient awake    Reviewed: Allergy & Precautions, NPO status , Patient's Chart, lab work & pertinent test results  History of Anesthesia Complications Negative for: history of anesthetic complications  Airway Mallampati: II  TM Distance: >3 FB Neck ROM: Full    Dental  (+) Edentulous Upper, Edentulous Lower   Pulmonary neg pulmonary ROS,    breath sounds clear to auscultation       Cardiovascular negative cardio ROS   Rhythm:Regular Rate:Normal     Neuro/Psych  Headaches, Depression Cauda equina syndrome with Chronic back pain: narcotics Does not ambulate well    GI/Hepatic GERD  Medicated and Controlled,(+)     substance abuse  ,   Endo/Other  Morbid obesity  Renal/GU stones Bladder dysfunction (neurogenic bladder)      Musculoskeletal  (+) narcotic dependent  Abdominal (+) + obese,   Peds  Hematology negative hematology ROS (+)   Anesthesia Other Findings   Reproductive/Obstetrics                            Anesthesia Physical Anesthesia Plan  ASA: III  Anesthesia Plan: General   Post-op Pain Management:    Induction: Intravenous  PONV Risk Score and Plan: 2 and Ondansetron and Dexamethasone  Airway Management Planned: LMA  Additional Equipment:   Intra-op Plan:   Post-operative Plan:   Informed Consent: I have reviewed the patients History and Physical, chart, labs and discussed the procedure including the risks, benefits and alternatives for the proposed anesthesia with the patient or authorized representative who has indicated his/her understanding and acceptance.       Plan Discussed with: CRNA and Surgeon  Anesthesia Plan Comments: (Plan routine monitors, GA- LMA OK)       Anesthesia Quick Evaluation

## 2018-03-18 NOTE — Op Note (Signed)
Operative Note  Preoperative diagnosis:  1.  Right renal and ureteral calculi  Postoperative diagnosis: 1.  Right renal and ureteral calculi  Procedure(s): 1.  Cystoscopy, right ureteroscopy, laser lithotripsy, stone basketing, retrograde pyelogram, ureteral stent exchange  Surgeon: Modena Slater, MD  Assistants: None  Anesthesia: General  Complications: None immediate  EBL: Minimal  Specimens: 1.  Kidney stones  Drains/Catheters: 1.  6 x 26 double-J ureteral stent, Foley catheter  Intraoperative findings: 1.  Normal urethra 2.  Edematous bladder consistent with chronic Foley use but no obvious concerning masses.  Right retrograde pyelogram revealed a well opacified kidney.  Ureteroscopy revealed an 8 mm proximal ureteral calculus fragmented to tiny fragments.  He also had another about 8 mm calculus in the right lower pole of the kidney as well as another smaller stone that were fragmented and basket extracted.  Indication: 63 year old male followed by Dr. McDiarmid for recurrent urinary tract infections was recently found to have a proximal right ureteral calculus with associated concern for impending sepsis status post stent placement.  He presents for definitive management of his stones.  Description of procedure:  The patient was identified and consent was obtained.  The patient was taken to the operating room and placed in the supine position.  The patient was placed under general anesthesia.  Perioperative antibiotics were administered.  The patient was placed in dorsal lithotomy.  Patient was prepped and draped in a standard sterile fashion and a timeout was performed.  A 21 French rigid cystoscope was advanced into the urethra and into the bladder.  Cystoscopy was performed with findings noted above.  I grasped the stent and pulled it just beyond the urethral meatus followed by advancement of a sensor wire through the stent and up to the kidney under fluoroscopic guidance  and removal of the stent.  A semirigid ureteroscope was advanced alongside the wire up to the proximal ureteral stone which was fragmented to tiny fragments.  I shot a retrograde pyelogram through the scope after visualizing the ureter up to the renal pelvis.  The findings are noted above.  I passed a second wire through the scope and withdrew the scope.  I then placed a 12 x 14 ureteral access sheath over the wire under continuous fluoroscopic guidance and removed in their sheath along with the wire.  Flexible ureteroscopy was performed which encountered 2 stones of interest which were fragmented to smaller fragments followed by basket extraction.  After all clinically significant stone fragments were basket extracted, only tiny fragments remained there were too small to basket.  I inspected the entire kidney and there were no other clinically significant stone fragments.  I therefore withdrew the scope along with the access sheath visualizing the entire ureter upon removal.  I then backloaded the wire onto a rigid cystoscope which was advanced into the bladder followed by routine placement of a 6 x 26 double-J ureteral stent in a standard fashion followed by removal of the wire.  Fluoroscopy confirmed proximal placement and direct visualization confirmed a good coil within the bladder.  I withdrew the scope and placed an 73 French Foley catheter.  Patient tolerated the procedure well and was stable postoperatively.  Plan: Return in 1 week for stent removal

## 2018-03-18 NOTE — Anesthesia Procedure Notes (Signed)
Procedure Name: LMA Insertion Date/Time: 03/18/2018 2:53 PM Performed by: Jessica Priest, CRNA Pre-anesthesia Checklist: Patient identified, Emergency Drugs available, Suction available and Patient being monitored Patient Re-evaluated:Patient Re-evaluated prior to induction Oxygen Delivery Method: Circle system utilized Preoxygenation: Pre-oxygenation with 100% oxygen Induction Type: IV induction Ventilation: Mask ventilation without difficulty LMA: LMA inserted LMA Size: 4.0 Number of attempts: 1 Airway Equipment and Method: Bite block Placement Confirmation: positive ETCO2 and breath sounds checked- equal and bilateral Tube secured with: Tape Dental Injury: Teeth and Oropharynx as per pre-operative assessment

## 2018-03-19 ENCOUNTER — Encounter (HOSPITAL_BASED_OUTPATIENT_CLINIC_OR_DEPARTMENT_OTHER): Payer: Self-pay | Admitting: Urology

## 2018-03-19 NOTE — Addendum Note (Signed)
Addendum  created 03/19/18 3606 by Shanon Payor, CRNA   Charge Capture section accepted

## 2018-05-07 DIAGNOSIS — T2120XA Burn of second degree of trunk, unspecified site, initial encounter: Secondary | ICD-10-CM | POA: Insufficient documentation

## 2019-01-13 DIAGNOSIS — Z125 Encounter for screening for malignant neoplasm of prostate: Secondary | ICD-10-CM | POA: Insufficient documentation

## 2019-02-02 IMAGING — CT CT ANGIO CHEST
2 of 6 series · 18 of 36 positions shown · IV contrast (Omni 300)
Comparison: None.

CLINICAL DATA: Fever with possible sepsis. No chest pain or
dyspnea. Abnormal appearance on chest x-ray.

EXAM:
CT ANGIOGRAPHY CHEST WITH CONTRAST
TECHNIQUE: Multidetector CT imaging of the chest was performed using the
standard protocol during bolus administration of intravenous
contrast. Multiplanar CT image reconstructions and MIPs were
obtained to evaluate the vascular anatomy.
CONTRAST:  100 cc of Isovue 370 IV

[Series 7: pe thins · axial · 0.68mm/px · z∈[+981,+1227]mm · 17 of 278 slices shown]
[im 16/278  lung]
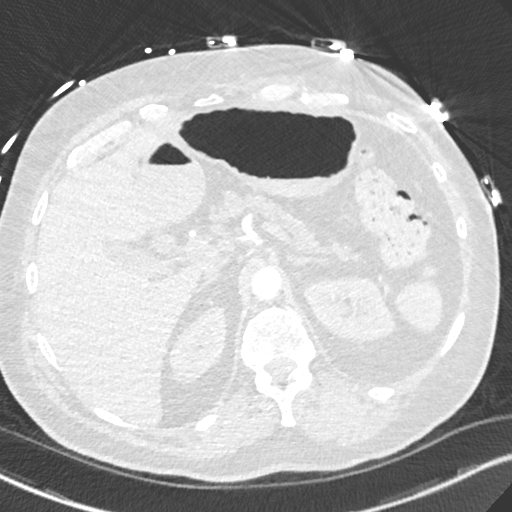
[im 31/278  mediastinal]
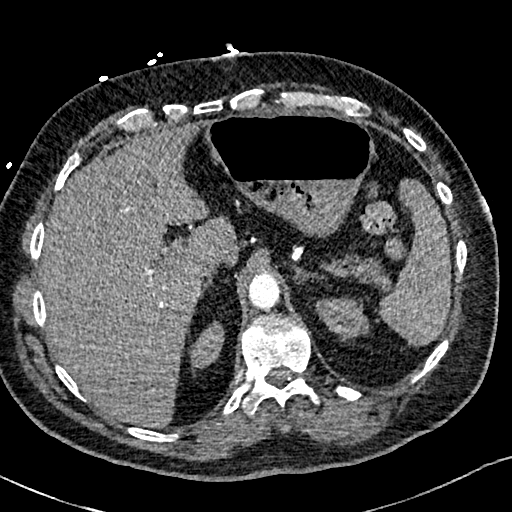
[im 47/278  lung]
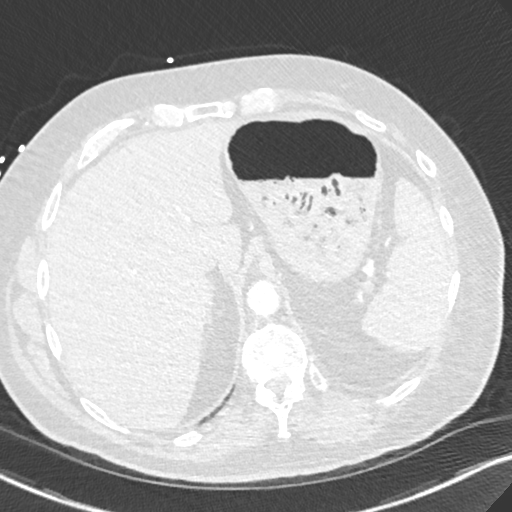
[im 62/278  mediastinal]
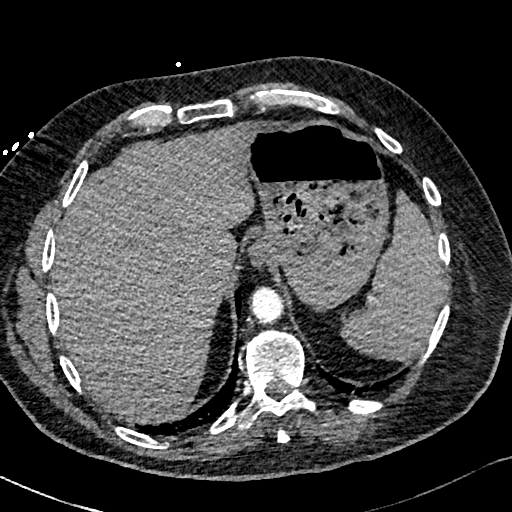
[im 77/278  lung]
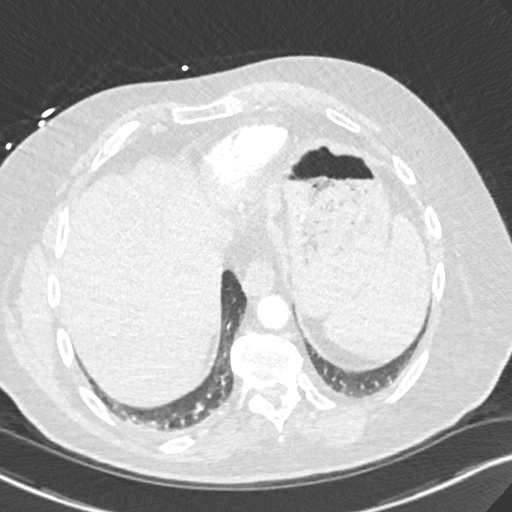
[im 93/278  mediastinal]
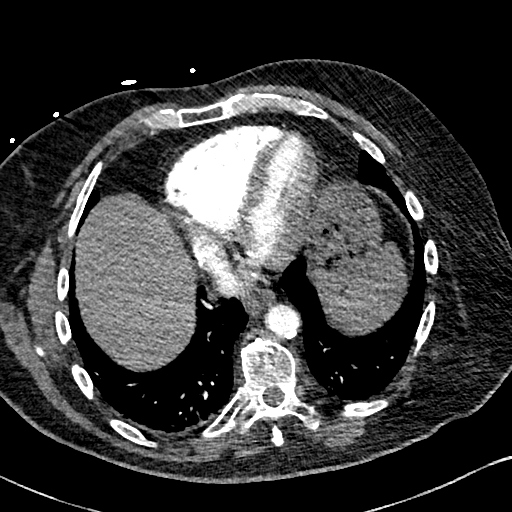
[im 108/278  lung]
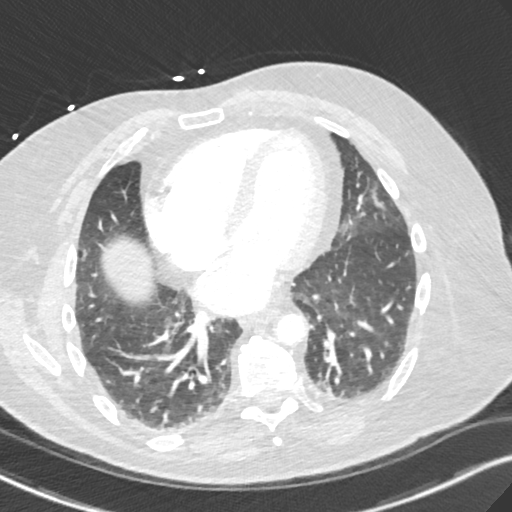
[im 124/278  mediastinal]
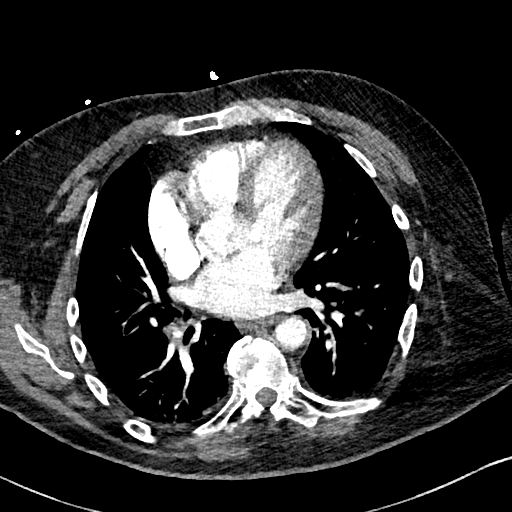
[im 139/278  lung]
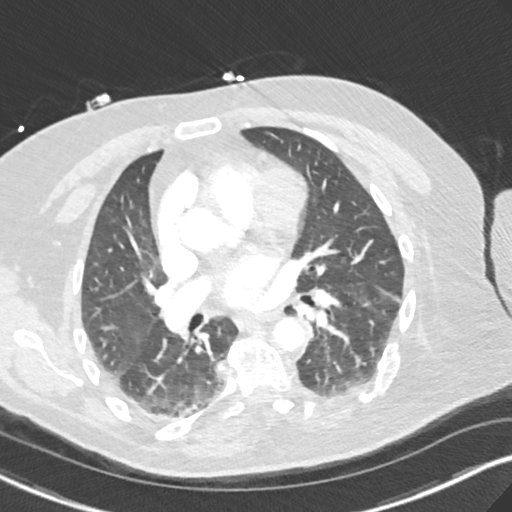
[im 154/278  mediastinal]
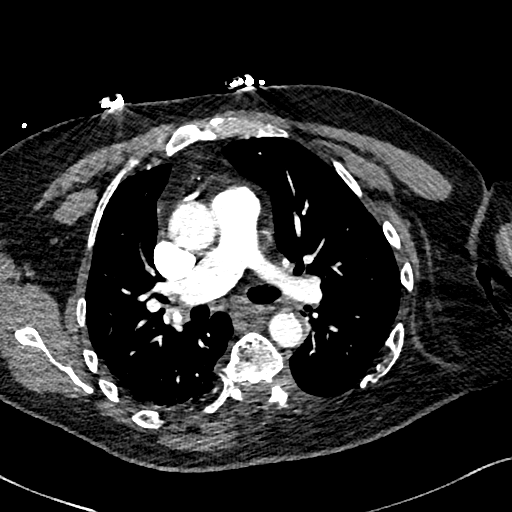
[im 170/278  lung]
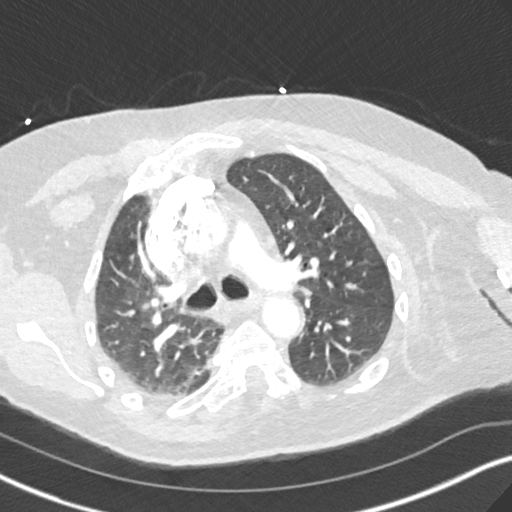
[im 185/278  mediastinal]
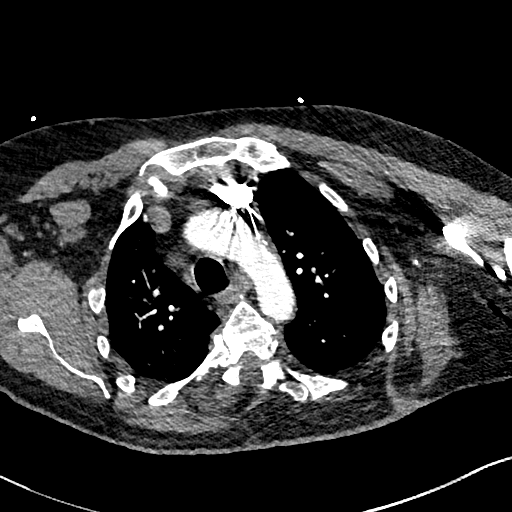
[im 201/278  lung]
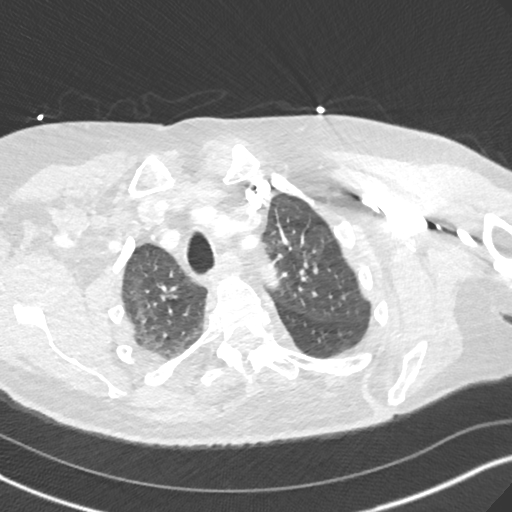
[im 216/278  mediastinal]
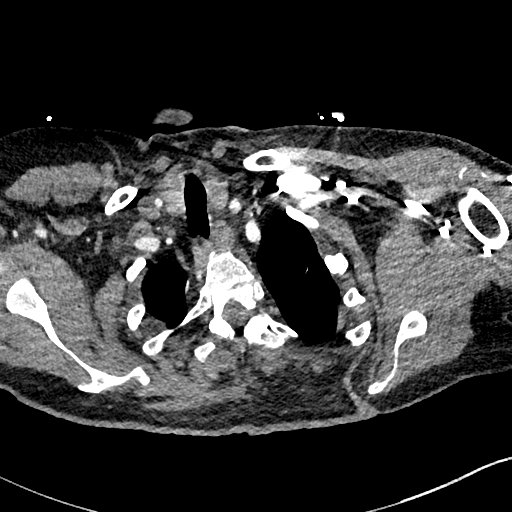
[im 231/278  lung]
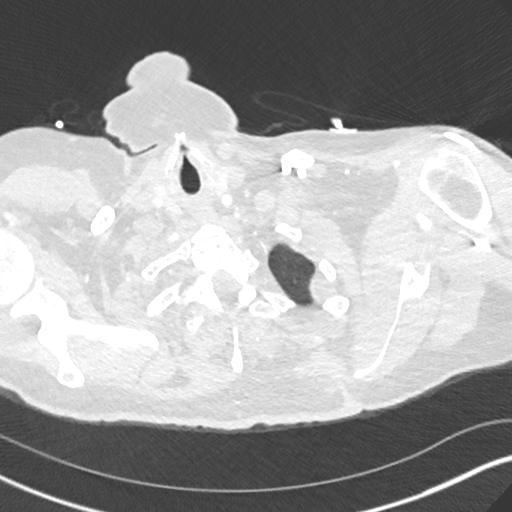
[im 247/278  mediastinal]
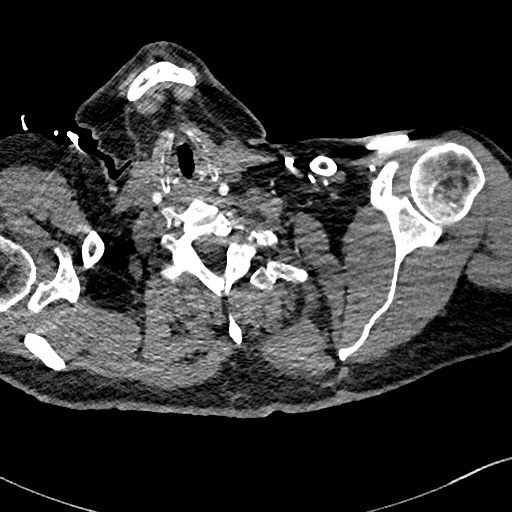
[im 262/278  lung]
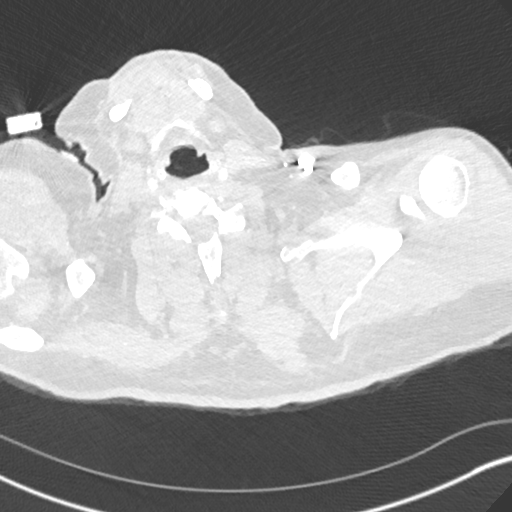

[Series 8: pe 2mm cor · coronal · 0.56mm/px · 1 of 118 slices shown]
[im 59/118  mediastinal]
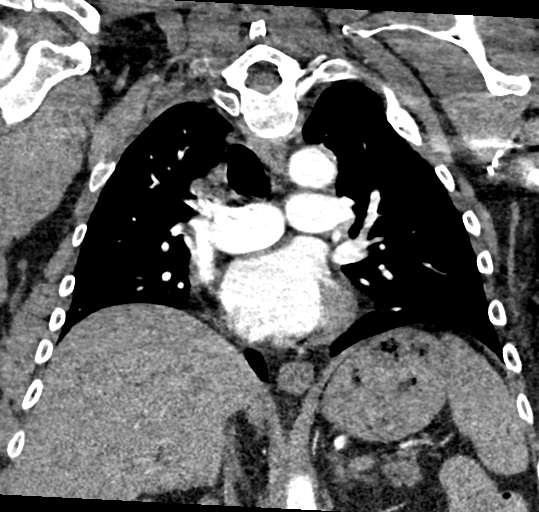

[18 of 36 positions shown; findings below may reference images not displayed]

FINDINGS: Cardiovascular: The prominence of the right mediastinal soft tissues
is attributable to an ectatic, tortuous thoracic aorta along side
the left brachiocephalic vein - SVC confluence. No acute pulmonary
embolus is identified. There is aortic atherosclerosis without
dissection or aneurysm. There is coronary arteriosclerosis
predominantly involving the LAD.

Mediastinum/Nodes: No enlarged mediastinal, hilar, or axillary lymph
nodes. Thyroid gland, trachea, and esophagus demonstrate no
significant findings.

Lungs/Pleura: Hypoventilatory changes are seen about both lungs
likely accounting for the mild diffuse ground-glass opacities noted.
Bibasilar atelectasis is seen at each lung base. No pneumonic
consolidation is identified.

Upper Abdomen: Small hiatal hernia. No acute abnormality within the
included upper abdomen.

Musculoskeletal: Thoracic spondylosis. No acute nor suspicious
osseous abnormalities. Chronic old left lower rib fractures.

Review of the MIP images confirms the above findings.
IMPRESSION: 1. The contour abnormality noted on the earlier same day chest
radiograph is attributable to a tortuous thoracic aorta along side
an the SVC-left brachiocephalic confluence. No focal aortic
aneurysm, adenopathy or paramediastinal mass is noted to account for
this finding.
2. Bibasilar dependent atelectasis with mild hypoventilatory change
of the lungs accounting for ground-glass appearance of the lungs.
3. Coronary arteriosclerosis.

Aortic Atherosclerosis (Z5IIJ-4XA.A).

## 2019-11-14 DIAGNOSIS — E871 Hypo-osmolality and hyponatremia: Secondary | ICD-10-CM | POA: Insufficient documentation

## 2019-11-24 DIAGNOSIS — E222 Syndrome of inappropriate secretion of antidiuretic hormone: Secondary | ICD-10-CM | POA: Insufficient documentation

## 2020-06-30 ENCOUNTER — Encounter: Payer: Self-pay | Admitting: Physical Medicine and Rehabilitation

## 2020-09-03 ENCOUNTER — Ambulatory Visit: Payer: Medicare Other | Admitting: Physical Medicine and Rehabilitation

## 2020-09-06 ENCOUNTER — Encounter: Payer: Self-pay | Admitting: Physical Medicine and Rehabilitation

## 2020-09-06 ENCOUNTER — Telehealth: Payer: Self-pay

## 2020-09-06 ENCOUNTER — Other Ambulatory Visit: Payer: Self-pay

## 2020-09-06 ENCOUNTER — Encounter
Payer: Worker's Compensation | Attending: Physical Medicine and Rehabilitation | Admitting: Physical Medicine and Rehabilitation

## 2020-09-06 VITALS — BP 125/82 | HR 86 | Temp 97.8°F | Ht 66.0 in

## 2020-09-06 DIAGNOSIS — Z993 Dependence on wheelchair: Secondary | ICD-10-CM

## 2020-09-06 DIAGNOSIS — R238 Other skin changes: Secondary | ICD-10-CM | POA: Diagnosis not present

## 2020-09-06 DIAGNOSIS — N319 Neuromuscular dysfunction of bladder, unspecified: Secondary | ICD-10-CM

## 2020-09-06 DIAGNOSIS — K592 Neurogenic bowel, not elsewhere classified: Secondary | ICD-10-CM | POA: Diagnosis not present

## 2020-09-06 DIAGNOSIS — G8220 Paraplegia, unspecified: Secondary | ICD-10-CM | POA: Diagnosis not present

## 2020-09-06 DIAGNOSIS — R252 Cramp and spasm: Secondary | ICD-10-CM | POA: Diagnosis not present

## 2020-09-06 MED ORDER — LUBIPROSTONE 8 MCG PO CAPS: 16.0000 ug | ORAL_CAPSULE | Freq: Two times a day (BID) | ORAL | 5 refills | Status: DC

## 2020-09-06 NOTE — Patient Instructions (Addendum)
Pt is a 65 yr old male with T6/T7 paraplegia- ASIA A complete paraplegia Injury 08/1996- industrial accident/Workers comp- boot strap got caught on in the mesh of steps- and threw him onto a platform- on a planer/lumbar stacker and burst T6/T7. Also has HTN and borderline DM. Also has neurogenic bowel and bladder and spasticity with pain pain and ITB pump.   Company-[employer]-  for air mattress- [phone]- [employer]ox[employer]  Need to call to give recs on air mattress- meets criteria for air mattress esp due to lipoma  (a lot of pressure on backside) that makes him very high risk for additional skin breakdown-  On thoracic back has had Stage II_III in past, and keeps breaking down.   2. Needs tilt and space power w/c- Pt is a paraplegia- complete- cannot do his own pressure relief in a manual w/c so keeps getting skin breakdown- also needs for pain control to be able to do tilt in space and recline and has LE edema, so needs elevating leg rests- also needs a ROHO cushion for hx of multiple pressure ulcers. - and possible a ROHO part of back, or pressure relieving back due to skin issues/breakdown on thoracic spine and paraspinals. Will give Rx for w/c evaluation  Will give Rx for OT evaluation /w/c.  Fleet Contras from [place] will call them to schedule W/C evaluation. Including pressure mapping because of lipoma.  Pt current w/c is not ultra lite weight and is NOT a w/c SCI patients use- he needs power for reclining and prevent further pressure ulcers- only reason he doesn's thave one now is in bed 22 hours/day. Pressure relief needs ot be done q15-20 minute sin w/c and he cannot do in current w/c at all. Also ROHO cushion is only cushion should be used if pt has had breakdown-  Should help with pain, help him stay up longer- usually more comfortable in wc than anywhere- cannot stay in bed 22 hours/day. . And being in bed increases back/buttock pain as well.  Of note, this is standard of care for him  at this time.   3. Bowel Program- 1. Bowel program for SCI patients (BP)   Use Dulcolax suppositories- generic is fine- - can try Glycerin Start with doing bowel program within 1 hour after a meal- breakfast or dinner Take bowels meds -Po/oral at opposite time doing bowel program- if BP (bowel program) in evening, give PO meds in AM; and vice versa.    Start with digital stimulation- up to 2nd knuckle- stimulate rectum x 30 seconds Place suppository- wait 10 minutes Do Dig stim/Digital stimulation) every 10-15 minutes until empty- usually 30-45 minutes.  If gets severely constipated, >3 days, can use Magnesium Citrate or milk of magnesium and follow in 4 hours with suppository or any type of enema.  Takes 6 weeks or so to get bowel to have a habit.      - should decrease amount of incontinence supplies.    4. Thinks has thousands of bugs in him- has sensations- Get O&P -ova and parasite stool sample done by PCP and can determine for sure if has bugs inside him. Suggest speaking to PCP about depression with psychotic features? Treating these Symptoms could be very helpful- since he's convinced of these "bugs". .    5. Will reduce Amitiza to 16 mcg daily (2 8 mcg pills) daily- if this doesn't work try Amitiza 24 mcg every other day.   6. Will focus on Spasticity at next appointment. Along  with other SCI issues.   7. F/U in 3 months- double appointment- SCI

## 2020-09-06 NOTE — Progress Notes (Signed)
Subjective:    Patient ID: Robert Mays, male    DOB: [birth year], 65 y.o.   MRN: [id number]  HPI Pt is a 65 yr old male with T6/T7 paraplegia- ASIA A complete paraplegia Injury 08/1996- industrial accident/Workers comp- boot strap got caught on in the mesh of steps- and threw him onto a platform- on a planer/lumbar stacker and burst T6/T7. Also has HTN and borderline DM. Also has neurogenic bowel and bladder and spasticity with pain pain and ITB pump.   Here for evaluation.   Has a pain pump with ITB/baclofen 314.75 mcg/day, Clonidine 69.94 mcg/day and Morphine 1.3988 mg/day. Gets pump refilled ~ 60 days even though alarm date is ~ 2.5-3 months- Due to be refilled 09/22/20.   Also on Nucynta  50 mg BID ER and Oxycontin 30 mg BID Trilepta 300 mg TID; Oral Baclofen 10 mg in Am, 20 mg in afternoon and bedtime; Oxybutynin 10 mg BID, gabapentin 600 mg TID  Hasn't seen a SCI doctor for years.  Last SCI doctor was Orthopedist.   Stopped walking with RW 2 years ago.  Too weak- couldn't hold self up.  Couldn't wear AFOs.   In bed 22 hours/day- when he gets up- bottom hurts so bad- cannot sit up longer.   Has a lift chair- Has hoyer lift- to get him up.  Cannot transfer on his own.  Thinks hoyer lift for him that works.   Has hospital bed; has just gotten worker's comp to give him an air mattress.     Injury at T6- L shoulder is limited- and has wound on scapula as well. Has oozed and gotten red as well.  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   Bottom is hurting right now.   Very HOH  Amitiza for constipation-    Needs some different type w/c-    Has chronic foley- Dr Charletta Cousin handles Bowels just no rhyme or reason- No control of stool and comes whenever it goes-   Pain Inventory Average Pain 7 Pain Right Now 7 My pain is constant, burning, tingling, and aching  LOCATION OF PAIN  feet, chest down to feet, back  BOWEL Number of stools per week: 14 or  more not pattern Oral laxative use Yes  Type of laxative stool softener Enema or suppository use No  History of colostomy No  Incontinent No   BLADDER Foley In and out cath, frequency n/a Able to self cath No  Bladder incontinence No  Frequent urination No  Leakage with coughing No  Difficulty starting stream No  Incomplete bladder emptying No    Mobility use a wheelchair needs help with transfers Do you have any goals in this area?  yes  Function disabled: date disabled 1998 I need assistance with the following:  dressing, bathing, toileting, meal prep, household duties, and shopping Do you have any goals in this area?  yes  Neuro/Psych bladder control problems bowel control problems weakness numbness tremor tingling trouble walking dizziness confusion depression anxiety  Prior Studies Any changes since last visit?  no  Physicians involved in your care Any changes since last visit?  no , New Patient   Family History  Problem Relation Age of Onset   Heart attack Father        Died in his 53's   Social History   Socioeconomic History   Marital status: Married    Spouse name: Not on file   Number of children: Not on file  Years of education: Not on file   Highest education level: Not on file  Occupational History   Not on file  Tobacco Use   Smoking status: Never   Smokeless tobacco: Current    Types: Chew  Vaping Use   Vaping Use: Never used  Substance and Sexual Activity   Alcohol use: No   Drug use: No   Sexual activity: Not Currently  Other Topics Concern   Not on file  Social History Narrative   Not on file   Social Determinants of Health   Financial Resource Strain: Not on file  Food Insecurity: Not on file  Transportation Needs: Not on file  Physical Activity: Not on file  Stress: Not on file  Social Connections: Not on file   Past Surgical History:  Procedure Laterality Date   ABDOMINAL SURGERY     BACK SURGERY      CYSTOSCOPY W/ URETERAL STENT PLACEMENT Right 02/22/2018   Procedure: CYSTOSCOPY, RIGHT RETROGRADE PYELOGRAM WITH URETERAL STENT PLACEMENT;  Surgeon: Alfredo Martinez, MD;  Location: [facility];  Service: Urology;  Laterality: Right;   CYSTOSCOPY/URETEROSCOPY/HOLMIUM LASER/STENT PLACEMENT Right 03/18/2018   Procedure: CYSTOSCOPY/URETEROSCOPY/HOLMIUM LASER/STENT PLACEMENT;  Surgeon: Crista Elliot, MD;  Location: [facility];  Service: Urology;  Laterality: Right;   INTRATHECAL PUMP IMPLANTATION Left 2000  ?   baclofin,clonidine,morphine infusion   Past Medical History:  Diagnosis Date   Cellulitis and abscess of left leg 07/2016   Chronic back pain    Depression    H/O spinal cord injury    Headache    Sepsis (HCC) 07/2016   BP 125/82   Pulse 86   Temp 97.8 F (36.6 C)   Ht 5\' 6"  (1.676 m)   SpO2 97%   BMI 32.28 kg/m   Opioid Risk Score:   Fall Risk Score:  `1  Depression screen PHQ 2/9  Depression screen PHQ 2/9 09/06/2020  Decreased Interest 1  Down, Depressed, Hopeless 1  PHQ - 2 Score 2  Altered sleeping 1  Tired, decreased energy 3  Change in appetite 1  Feeling bad or failure about yourself  0  Trouble concentrating 2  Moving slowly or fidgety/restless 0  Suicidal thoughts 0  PHQ-9 Score 9    Review of Systems  Respiratory:  Positive for cough, shortness of breath and wheezing.   Cardiovascular:  Positive for leg swelling.  Gastrointestinal:  Positive for constipation and nausea.  Genitourinary:        Foley  Neurological:  Positive for dizziness, tremors and weakness.       Tingling, spasms  All other systems reviewed and are negative.     Objective:   Physical Exam  Awake, alert, appropriate; perseverating on bugs in stool, accompanied by wife, NAD Was Showed pictures of backside and L shoulder/scapula- skin breakdown scarred, but has large lipoma on backside as well-   MS: LE's 0/5 in Les 2+ le edema B/L     Assessment & Plan:    Pt is a 65 yr old male with T6/T7 paraplegia- ASIA A complete paraplegia Injury 08/1996- industrial accident/Workers comp- boot strap got caught on in the mesh of steps- and threw him onto a platform- on a 09/1996 and burst T6/T7. Also has HTN and borderline DM. Also has neurogenic bowel and bladder and spasticity with pain pain and ITB pump.   Company-[employer]-  for air mattress- [phone]- [employer]  Need to call to give recs on air mattress- meets  criteria for air mattress esp due to lipoma  (a lot of pressure on backside) that makes him very high risk for additional skin breakdown-  On thoracic back has had Stage II_III in past, and keeps breaking down.   2. Needs tilt and space power w/c- Pt is a paraplegia- complete- cannot do his own pressure relief in a manual w/c so keeps getting skin breakdown- also needs for pain control to be able to do tilt in space and recline and has LE edema, so needs elevating leg rests- also needs a ROHO cushion for hx of multiple pressure ulcers. - and possible a ROHO part of back, or pressure relieving back due to skin issues/breakdown on thoracic spine and paraspinals. Will give Rx for w/c evaluation  Will give Rx for OT evaluation /w/c.  Fleet Contras from [facility] will call them to schedule W/C evaluation. Including pressure mapping because of lipoma.  Pt current w/c is not ultra lite weight and is NOT a w/c SCI patients use- he needs power for reclining and prevent further pressure ulcers- only reason he doesn's thave one now is in bed 22 hours/day. Pressure relief needs ot be done q15-20 minute sin w/c and he cannot do in current w/c at all. Also ROHO cushion is only cushion should be used if pt has had breakdown-  Should help with pain, help him stay up longer- usually more comfortable in wc than anywhere- cannot stay in bed 22 hours/day. . And being in bed increases back/buttock pain as well.  Of note, this is standard of care for  him at this time.   3. Bowel Program- 1. Bowel program for SCI patients (BP)   Use Dulcolax suppositories- generic is fine- - can try Glycerin Start with doing bowel program within 1 hour after a meal- breakfast or dinner Take bowels meds -Po/oral at opposite time doing bowel program- if BP (bowel program) in evening, give PO meds in AM; and vice versa.    Start with digital stimulation- up to 2nd knuckle- stimulate rectum x 30 seconds Place suppository- wait 10 minutes Do Dig stim/Digital stimulation) every 10-15 minutes until empty- usually 30-45 minutes.  If gets severely constipated, >3 days, can use Magnesium Citrate or milk of magnesium and follow in 4 hours with suppository or any type of enema.  Takes 6 weeks or so to get bowel to have a habit.      - should decrease amount of incontinence supplies.    4. Thinks has thousands of bugs in him- has sensations- Get O&P -ova and parasite stool sample done by PCP and can determine for sure if has bugs inside him. Suggest speaking to PCP about depression with psychotic features? Treating these Symptoms could be very helpful- since he's convinced of these "bugs". .    5. Will reduce Amitiza to 16 mcg daily (2 8 mcg pills) daily- if this doesn't work try Amitiza 24 mcg every other day.   6. Will focus on Spasticity at next appointment. Along with other SCI issues.   7. F/U in 3 months- double appointment- SCI  I spent a total of 65 minutes on visit- specifically going over bowels and w/c, and air mattress. And talking with nursing case manager

## 2020-09-06 NOTE — Telephone Encounter (Signed)
Ok- let's see what they say- but it cannot replace the power w/c- thanks- ML

## 2020-09-06 NOTE — Telephone Encounter (Signed)
Robert Mays wanted you to know that [employer] will be sending you an Email. The message may ask about a outdoor electrical scooter that Robert Mays rides on the lawn at home. It gives him enjoyment. Someone in the  family is always with him while he is rides on it. Robert Mays thinks [employer] may ask your opinion about the scooter.   Call back phone [phone].

## 2020-09-13 ENCOUNTER — Telehealth: Payer: Self-pay | Admitting: *Deleted

## 2020-09-13 ENCOUNTER — Telehealth: Payer: Self-pay

## 2020-09-13 NOTE — Telephone Encounter (Signed)
Amy, [facility]CM for patient called stating that Dr. Berline Chough was to get in touch with [employer] about what kinds of air mattresses patient is to have and they have not heard anything. Please advise.

## 2020-09-13 NOTE — Telephone Encounter (Signed)
Mrs Genova called and has some questions for Dr Berline Chough.  She would like a call back from her.

## 2020-09-17 NOTE — Telephone Encounter (Signed)
I spoke to multiple reps- they suggested a low air loss mattress, not a mattress overlay, but not a rotating iar mattress at this time- I attempted to call [employer]- didn't get through- can try again next week.  However if you are concerned that need exact brand, when see OT for w/c evaluation, can also ask them to address this topic? ML- wrote Rx for mattress

## 2020-09-17 NOTE — Telephone Encounter (Signed)
Order faxed to [facility]

## 2020-09-23 ENCOUNTER — Telehealth: Payer: Self-pay

## 2020-09-23 NOTE — Telephone Encounter (Signed)
May from [employer] called stating she need more information from the air mattress order. [phone] Arline Asp, wife of patient called as well stating that you mention you was suppose to call [employer]. Also May called her too about needing more info on the air mattress.

## 2020-09-23 NOTE — Telephone Encounter (Signed)
May is returning Dr Dahlia Client call.  She tried calling her number and it only rang and no answer.

## 2020-12-08 ENCOUNTER — Encounter: Payer: Self-pay | Admitting: Physical Medicine and Rehabilitation

## 2020-12-08 ENCOUNTER — Encounter
Payer: Worker's Compensation | Attending: Physical Medicine and Rehabilitation | Admitting: Physical Medicine and Rehabilitation

## 2020-12-08 ENCOUNTER — Other Ambulatory Visit: Payer: Self-pay

## 2020-12-08 VITALS — BP 153/91 | HR 67

## 2020-12-08 DIAGNOSIS — Z993 Dependence on wheelchair: Secondary | ICD-10-CM | POA: Diagnosis not present

## 2020-12-08 DIAGNOSIS — R238 Other skin changes: Secondary | ICD-10-CM | POA: Insufficient documentation

## 2020-12-08 DIAGNOSIS — R252 Cramp and spasm: Secondary | ICD-10-CM | POA: Insufficient documentation

## 2020-12-08 DIAGNOSIS — G8220 Paraplegia, unspecified: Secondary | ICD-10-CM | POA: Diagnosis not present

## 2020-12-08 MED ORDER — LIDOCAINE HCL URETHRAL/MUCOSAL 2 % EX GEL: 1.0000 "application " | CUTANEOUS | 5 refills | Status: DC | PRN

## 2020-12-08 MED ORDER — TIZANIDINE HCL 4 MG PO TABS: 4.0000 mg | ORAL_TABLET | Freq: Two times a day (BID) | ORAL | 5 refills | Status: DC
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
## 2020-12-15 ENCOUNTER — Telehealth: Payer: Self-pay

## 2020-12-15 MED ORDER — LIDOCAINE HCL URETHRAL/MUCOSAL 2 % EX GEL
1.0000 | CUTANEOUS | 5 refills | Status: DC | PRN
Start: 2020-12-15 — End: 2021-07-22

## 2020-12-15 NOTE — Telephone Encounter (Signed)
[facility] called to say that they do not stock the Lidocaine jelly. Called patient's wife to see where it needs to be sent. Sent to [address]

## 2021-03-14 ENCOUNTER — Encounter
Payer: Worker's Compensation | Attending: Physical Medicine and Rehabilitation | Admitting: Physical Medicine and Rehabilitation

## 2021-03-14 ENCOUNTER — Other Ambulatory Visit: Payer: Self-pay

## 2021-03-14 ENCOUNTER — Encounter: Payer: Self-pay | Admitting: Physical Medicine and Rehabilitation

## 2021-03-14 VITALS — BP 118/74 | HR 84

## 2021-03-14 DIAGNOSIS — R252 Cramp and spasm: Secondary | ICD-10-CM | POA: Diagnosis present

## 2021-03-14 DIAGNOSIS — G8929 Other chronic pain: Secondary | ICD-10-CM

## 2021-03-14 DIAGNOSIS — G8220 Paraplegia, unspecified: Secondary | ICD-10-CM

## 2021-03-14 DIAGNOSIS — Z993 Dependence on wheelchair: Secondary | ICD-10-CM

## 2021-03-14 MED ORDER — LUBIPROSTONE 8 MCG PO CAPS: 8.0000 ug | ORAL_CAPSULE | Freq: Two times a day (BID) | ORAL | 5 refills | Status: DC
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
## 2021-04-13 ENCOUNTER — Telehealth: Payer: Self-pay

## 2021-04-13 DIAGNOSIS — Z978 Presence of other specified devices: Secondary | ICD-10-CM | POA: Insufficient documentation

## 2021-04-13 NOTE — Telephone Encounter (Signed)
The number of pills is fine- so the Rx is fine- ML

## 2021-04-13 NOTE — Telephone Encounter (Signed)
The pharmacy sent a fax stating that the Lubiprostone was sent in to take twice daily but the wife states that he takes it 4 times a day. They need clarification on the directions. Please advise ?

## 2021-04-21 ENCOUNTER — Encounter: Payer: Self-pay | Admitting: Cardiology

## 2021-04-21 DIAGNOSIS — I6389 Other cerebral infarction: Secondary | ICD-10-CM | POA: Diagnosis not present

## 2021-04-27 ENCOUNTER — Telehealth: Payer: Self-pay | Admitting: Physical Medicine and Rehabilitation

## 2021-04-27 NOTE — Telephone Encounter (Signed)
Tammy from [facility] Atty office states a request for clarification on the shower requirements was faxed [phone]- I advised we have no note any documentation came to offfice.  She will refax it - can we please have ML review the question and refax to them  Tammy phone number [phone]-- they have copy of the med. Rec. - just want clarity. ?

## 2021-06-15 ENCOUNTER — Telehealth: Payer: Self-pay | Admitting: *Deleted

## 2021-06-15 NOTE — Telephone Encounter (Signed)
Mrs Donahue called and said MR [place] thoracic MRI had been done. Results was sent to Dr Roderic Ovens. She is wanting to know if Dr Berline Chough has seen it, or how she could send it to [place].  Looks like results are viewable in CARE EVERYWHERE.

## 2021-06-22 ENCOUNTER — Ambulatory Visit: Payer: Medicare HMO | Admitting: Physical Medicine and Rehabilitation

## 2021-07-20 DIAGNOSIS — J690 Pneumonitis due to inhalation of food and vomit: Secondary | ICD-10-CM | POA: Insufficient documentation

## 2021-07-20 DIAGNOSIS — G950 Syringomyelia and syringobulbia: Secondary | ICD-10-CM | POA: Insufficient documentation

## 2021-07-22 ENCOUNTER — Encounter: Payer: Self-pay | Admitting: Physical Medicine and Rehabilitation

## 2021-07-22 ENCOUNTER — Encounter
Payer: Worker's Compensation | Attending: Physical Medicine and Rehabilitation | Admitting: Physical Medicine and Rehabilitation

## 2021-07-22 VITALS — BP 129/80 | HR 87 | Ht 66.0 in

## 2021-07-22 DIAGNOSIS — G8220 Paraplegia, unspecified: Secondary | ICD-10-CM | POA: Diagnosis present

## 2021-07-22 DIAGNOSIS — J189 Pneumonia, unspecified organism: Secondary | ICD-10-CM | POA: Diagnosis present

## 2021-07-22 DIAGNOSIS — Z993 Dependence on wheelchair: Secondary | ICD-10-CM

## 2021-07-22 DIAGNOSIS — R252 Cramp and spasm: Secondary | ICD-10-CM | POA: Diagnosis not present

## 2021-07-22 DIAGNOSIS — J40 Bronchitis, not specified as acute or chronic: Secondary | ICD-10-CM | POA: Insufficient documentation

## 2021-07-22 NOTE — Progress Notes (Signed)
Subjective:     Patient ID: Robert Mays, male   DOB: January 10, 1956, 66 y.o.   MRN: [id number]  HPI  Pt is a 66 yr old male with T6/T7 paraplegia- ASIA A complete paraplegia Injury 08/1996- industrial accident/Workers comp- boot strap got caught on in the mesh of steps- and threw him onto a platform- on a planer/lumbar stacker and burst T6/T7. Also has HTN and borderline DM. Also has neurogenic bowel and bladder and spasticity with pain pain and ITB pump.    Has a large syrinx  C7-T12- has had a syrinx in past and was drained, however has recurred, but not clear when did so.    Occ has pain that radiates down arm- 2x/week. Otherwise, hasn't had any Sx's in arms that are weaker.   Wife doesn't see reason to get syrinx fixed if doesn't change function; concerned about   Getting pneumonia more often- or bronchitis- was in hospital 2 weeks ago. Was septic with UTI and pneumonia in L lung and also was in hospital in March 2023. Also back  on PO ABX since "pneumonia isn't better".     Na is 126- usually 130s- but has been as low as 117.    Has a flutter valve- but uses daily- not regularly during the day.  Asking about vest to help break up congestion.   Cannot breathe deep enough to keep lungs clear.   Had pain pump replaced 05/31/21- was due due to battery running out.   Cannot get pt in/out of shower- and they haven't fixed shower at this time. Not sure what the plan is.   Zanaflex- taking 1.5 pills/day- 2 mg in AM and 4 mg in PM- hasn't complained near as much and isn't near as sleepy- gotten used to it.   When getting infections ,sleeps more.       Pain Inventory Average Pain 7 Pain Right Now 7 My pain is burning, dull, stabbing, and aching  LOCATION OF PAIN  back, buttocks, groin, hip, knee, leg ankle, toes  BOWEL Number of stools per week: 10-14 Oral laxative use  Amitiza    BLADDER Foley  Mobility use a wheelchair needs help with transfers  Function disabled:  date disabled 08/1996 I need assistance with the following:  dressing, bathing, toileting, meal prep, household duties, and shopping  Neuro/Psych bladder control problems bowel control problems weakness numbness tremor spasms dizziness confusion depression  Prior Studies Any changes since last visit?  yes In patient visit & recent pump replacement [facility])  Physicians involved in your care Any changes since last visit?  yes   Family History  Problem Relation Age of Onset   Heart attack Father        Died in his 64's   Social History   Socioeconomic History   Marital status: Married    Spouse name: Not on file   Number of children: Not on file   Years of education: Not on file   Highest education level: Not on file  Occupational History   Not on file  Tobacco Use   Smoking status: Never   Smokeless tobacco: Current    Types: Chew  Vaping Use   Vaping Use: Never used  Substance and Sexual Activity   Alcohol use: No   Drug use: No   Sexual activity: Not Currently  Other Topics Concern   Not on file  Social History Narrative   Not on file   Social Determinants of Health   Financial Resource  Strain: Not on file  Food Insecurity: Not on file  Transportation Needs: Not on file  Physical Activity: Not on file  Stress: Not on file  Social Connections: Not on file   Past Surgical History:  Procedure Laterality Date   ABDOMINAL SURGERY     BACK SURGERY     CYSTOSCOPY W/ URETERAL STENT PLACEMENT Right 02/22/2018   Procedure: CYSTOSCOPY, RIGHT RETROGRADE PYELOGRAM WITH URETERAL STENT PLACEMENT;  Surgeon: Alfredo Martinez, MD;  Location: [facility];  Service: Urology;  Laterality: Right;   CYSTOSCOPY/URETEROSCOPY/HOLMIUM LASER/STENT PLACEMENT Right 03/18/2018   Procedure: CYSTOSCOPY/URETEROSCOPY/HOLMIUM LASER/STENT PLACEMENT;  Surgeon: Crista Elliot, MD;  Location: [facility];  Service: Urology;  Laterality: Right;   INTRATHECAL PUMP  IMPLANTATION Left 2000  ?   baclofin,clonidine,morphine infusion   Past Medical History:  Diagnosis Date   Cellulitis and abscess of left leg 07/2016   Chronic back pain    Depression    H/O spinal cord injury    Headache    Sepsis (HCC) 07/2016   BP 129/80   Pulse 87   Ht 5\' 6"  (1.676 m)   SpO2 95%   BMI 32.28 kg/m   Opioid Risk Score:   Fall Risk Score:  `1  Depression screen [facility] 2/9     09/06/2020   10:08 AM  Depression screen PHQ 2/9  Decreased Interest 1  Down, Depressed, Hopeless 1  PHQ - 2 Score 2  Altered sleeping 1  Tired, decreased energy 3  Change in appetite 1  Feeling bad or failure about yourself  0  Trouble concentrating 2  Moving slowly or fidgety/restless 0  Suicidal thoughts 0  PHQ-9 Score 9    Review of Systems  Constitutional:        Weight gain  Respiratory:  Positive for cough.   Cardiovascular:  Positive for leg swelling.  Genitourinary:        Foley in place  Musculoskeletal:  Positive for arthralgias and gait problem.       Paraplegia   All other systems reviewed and are negative.      Objective:   Physical Exam  Awake, alert, appropriate, accompanied by wife; in power w/c; with hoyer lift sling under him, NAD  MS: Ue's strength 5-/5 in biceps, triceps, WE, grip and finger abduction is also basically intact No movements in LE's B/L   Neuro: Decreased sensation/absent at T2 and below-    MAS of 1+ to 2 in LE's- a few beats of clonus on LLE.   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 Also has HTN and borderline DM. Also has neurogenic bowel and bladder and spasticity with pain pain and ITB pump.       Plan:      If stable and doesn't have a worsening of weakness in arms, then wife and I are agreed that surgical  intervention is not appropriate at this time.  Esp since has been basically the same. So, think doing a fenestration or drain put in syrinx will not IMPROVE level of function, so don't see that surgery is appropriate at this time. If DOES have decline in use of  arms, then would need to see [facility]- strongly suggest Dr Fredric Dine- he's done multiple syrinx surgeries for me in past with good results.   2.  Patient's respiratory issues- are 100% related due to losing his T3-T6 respiratory muscles secondary to his syrinx- his pneumonia is directly related to his paraplegia- and it's extremely common; and directly correlated  to the new level of his injury. Which, FYI, is extremely likely not to be new- but occurred in the last 3-4 years, since Syrinx goes up to C7 and not higher at this time. Cannot guarantee when it occurred, however last surgery was in 2017, and syrinxs can occur, re-accumulate usually within ~ 1-2 years. Need Recheck MRI in 6-12 months to see if getting longer. Depending on what surgeon says if 6 months or 1 year for f/u.    3. Has pneumonia vaccines and flu shot; doesn't do COVID vaccines per pt.   4. Strongly suggest using flutter valve every 2-4 hours while awake- 10 reps every 2-4 hours.  If continues to get pneumonia >2x/year, then might have to get vibrating vest that use frequently in Cerebral palsy or quadriplegics to help reduce amount of pneumonia /hospital admissions.   5.   Checking with workman's comp on plan for shower fix- needs zero threshold to enter shower with shower chair. 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 Needs to be done.   6. Hasn't gotten any assessment for using his pool.  7.  Went over MRI of thoracic spine- Syrinx from C7- T12.  Went over report and educated pt/wife on MRI results.    8. Con't Zanaflex/Amitiza  and other meds I prescribe.   9. F/U in 3 months double appt- SCI     I spent a total of 43   minutes on total care today- >50% coordination of care- due to discussing with worker's comp and discussing Syrinx and plans as above/detailed.

## 2021-07-22 NOTE — Patient Instructions (Signed)
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 Also has HTN and borderline DM. Also has neurogenic bowel and bladder and spasticity with pain pain and ITB pump.       Plan:     If stable and doesn't have a worsening of weakness in arms, then wife and I are agreed that surgical  intervention is not appropriate at this time.  Esp since has been basically the same. So, think doing a fenestration or drain put in syrinx will not IMPROVE level of function, so don't see that surgery is appropriate at this time. If DOES have decline in use of arms, then would need to see [facility]surgeons- strongly suggest Dr Fredric Dine- he's done multiple syrinx surgeries for me in past with good results.   2.  Patient's respiratory issues- are 100% related due to losing his T3-T6 respiratory muscles secondary to his syrinx- his pneumonia is directly related to his paraplegia- and it's extremely common; and directly correlated  to the new level of his injury. Which, FYI, is extremely likely not to be new- but occurred in the last 3-4 years, since Syrinx goes up to C7 and not higher at this time. Cannot guarantee when it occurred, however last surgery was in 2017, and syrinxs can occur, re-accumulate usually within ~ 1-2 years. Need Recheck MRI in 6-12 months to see if getting longer. Depending on what surgeon says if 6 months or 1 year for f/u.    3. Has pneumonia vaccines and flu shot; doesn't do COVID vaccines per pt.   4. Strongly suggest using flutter valve every 2-4 hours while awake- 10 reps every 2-4 hours.  If continues to get pneumonia >2x/year, then might have to get vibrating vest that use frequently in Cerebral palsy or quadriplegics to help reduce amount of  pneumonia /hospital admissions.   5.   Checking with workman's comp on plan for shower fix- needs zero threshold to enter shower with shower chair. 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 Needs to be done.   6. Hasn't gotten any assessment for using his pool.  7.  Went over MRI of thoracic spine- Syrinx from C7- T12.  Went over report and educated pt/wife on MRI results.    8. Con't Zanaflex/Amitiza and other meds I prescribe.   9. F/U in 3 months double appt- SCI

## 2021-11-10 ENCOUNTER — Telehealth: Payer: Self-pay | Admitting: Physical Medicine and Rehabilitation

## 2021-11-10 MED ORDER — TIZANIDINE HCL 4 MG PO TABS: 4.0000 mg | ORAL_TABLET | Freq: Two times a day (BID) | ORAL | 1 refills | Status: DC

## 2021-11-10 NOTE — Telephone Encounter (Signed)
Per patient's wife several requests from pharmacy have been sent to our office and pharmacy has received any responses. Requesting Tizanidine

## 2021-11-21 ENCOUNTER — Encounter: Payer: Self-pay | Admitting: Physical Medicine and Rehabilitation

## 2021-11-21 ENCOUNTER — Encounter
Payer: Worker's Compensation | Attending: Physical Medicine and Rehabilitation | Admitting: Physical Medicine and Rehabilitation

## 2021-11-21 VITALS — BP 103/69 | HR 99

## 2021-11-21 DIAGNOSIS — J189 Pneumonia, unspecified organism: Secondary | ICD-10-CM | POA: Insufficient documentation

## 2021-11-21 DIAGNOSIS — G8220 Paraplegia, unspecified: Secondary | ICD-10-CM | POA: Insufficient documentation

## 2021-11-21 DIAGNOSIS — N319 Neuromuscular dysfunction of bladder, unspecified: Secondary | ICD-10-CM | POA: Insufficient documentation

## 2021-11-21 DIAGNOSIS — K592 Neurogenic bowel, not elsewhere classified: Secondary | ICD-10-CM | POA: Insufficient documentation

## 2021-11-21 DIAGNOSIS — J40 Bronchitis, not specified as acute or chronic: Secondary | ICD-10-CM | POA: Insufficient documentation

## 2021-11-21 DIAGNOSIS — Z993 Dependence on wheelchair: Secondary | ICD-10-CM | POA: Insufficient documentation

## 2021-11-21 MED ORDER — LUBIPROSTONE 8 MCG PO CAPS: 8.0000 ug | ORAL_CAPSULE | Freq: Four times a day (QID) | ORAL | 5 refills | Status: DC

## 2021-11-21 MED ORDER — SILVER SULFADIAZINE 1 % EX CREA: 1.0000 | TOPICAL_CREAM | Freq: Two times a day (BID) | CUTANEOUS | 5 refills | Status: DC

## 2021-11-21 NOTE — Progress Notes (Signed)
Subjective:    Patient ID: Robert Mays, male    DOB: [birth year], 66 y.o.   MRN: [id number]  HPI  Pt is a 66 yr old male with new T2 paraplegia due to very large syrinx- prior T6/T7 paraplegia- ASIA A complete paraplegia Injury 08/1996- industrial accident/Workers comp- boot strap got caught on in the mesh of steps- and threw him onto a platform- on a planer/lumbar stacker and burst T6/T7. Also has HTN and borderline DM. Also has neurogenic bowel and bladder and spasticity with pain pain and ITB pump.     "Same old, same old"  1 week ago Sunday, have having so bad spasms Worsening at level SCI pain- moving made it worse.  Esp 1 week ago- making it hard ot breathe- like pushing along lungs.   Having difficulties with bronchitis/pneumonia- lately, so that's likely why it's been "acting up"    Pain level has been higher lately- ~ 2 weeks- feet burning more/worse and sitting up is more uncomfortable- buttocks and up to T3 is been worse-   Sees Dr Mechele Dawley for pain management- Takes Nucynta and Oxycodone and Tiny dose of morphine in the ITB pump- with clonidine and Baclofne in pump.      Pain Inventory Average Pain 8 Pain Right Now 7 My pain is sharp, burning, dull, stabbing, tingling, and aching  LOCATION OF PAIN  shoulder, back, buttocks, hip, thigh, leg ankle, toes  BOWEL Number of stools per week: 8-10 Oral laxative use Yes  Type of laxative amitiza Enema or suppository use No  History of colostomy No  Incontinent No   BLADDER Foley In and out cath, frequency . Able to self cath  . Bladder incontinence No  Frequent urination No  Leakage with coughing No  Difficulty starting stream No  Incomplete bladder emptying No    Mobility how many minutes can you walk? 0 ability to climb steps?  no do you drive?  no use a wheelchair needs help with transfers  Function disabled: date disabled . I need assistance with the following:  dressing, bathing, toileting, meal  prep, household duties, and shopping  Neuro/Psych bladder control problems bowel control problems weakness numbness tingling spasms dizziness confusion  Prior Studies Any changes since last visit?  no  Physicians involved in your care Any changes since last visit?  no   Family History  Problem Relation Age of Onset   Heart attack Father        Died in his 75's   Social History   Socioeconomic History   Marital status: Married    Spouse name: Not on file   Number of children: Not on file   Years of education: Not on file   Highest education level: Not on file  Occupational History   Not on file  Tobacco Use   Smoking status: Never   Smokeless tobacco: Current    Types: Chew  Vaping Use   Vaping Use: Never used  Substance and Sexual Activity   Alcohol use: No   Drug use: No   Sexual activity: Not Currently  Other Topics Concern   Not on file  Social History Narrative   Not on file   Social Determinants of Health   Financial Resource Strain: Not on file  Food Insecurity: Not on file  Transportation Needs: Not on file  Physical Activity: Not on file  Stress: Not on file  Social Connections: Not on file   Past Surgical History:  Procedure Laterality Date  ABDOMINAL SURGERY     BACK SURGERY     CYSTOSCOPY W/ URETERAL STENT PLACEMENT Right 02/22/2018   Procedure: CYSTOSCOPY, RIGHT RETROGRADE PYELOGRAM WITH URETERAL STENT PLACEMENT;  Surgeon: Bjorn Loser, MD;  Location: [facility];  Service: Urology;  Laterality: Right;   CYSTOSCOPY/URETEROSCOPY/HOLMIUM LASER/STENT PLACEMENT Right 03/18/2018   Procedure: CYSTOSCOPY/URETEROSCOPY/HOLMIUM LASER/STENT PLACEMENT;  Surgeon: Lucas Mallow, MD;  Location: [facility];  Service: Urology;  Laterality: Right;   INTRATHECAL PUMP IMPLANTATION Left 2000  ?   baclofin,clonidine,morphine infusion   Past Medical History:  Diagnosis Date   Cellulitis and abscess of left leg 07/2016   Chronic back  pain    Depression    H/O spinal cord injury    Headache    Sepsis ([place]) 07/2016   BP 103/69   Pulse 99   SpO2 96%   Opioid Risk Score:   Fall Risk Score:  `1  Depression screen [facility] 2/9     07/22/2021    9:42 AM 09/06/2020   10:08 AM  Depression screen PHQ 2/9  Decreased Interest 1 1  Down, Depressed, Hopeless 1 1  PHQ - 2 Score 2 2  Altered sleeping  1  Tired, decreased energy  3  Change in appetite  1  Feeling bad or failure about yourself   0  Trouble concentrating  2  Moving slowly or fidgety/restless  0  Suicidal thoughts  0  PHQ-9 Score  9      Review of Systems  Respiratory:  Positive for cough, shortness of breath and wheezing.   Gastrointestinal:  Positive for nausea.  Musculoskeletal:  Positive for back pain.  Neurological:  Positive for weakness and numbness.  All other systems reviewed and are negative.     Objective:   Physical Exam  Awake, alert, appropriate, not completely oriented; in power w/c; wife accompanying pt, NAD Has hoyer sling underneath him Has chronic foley- not SPC MAS of 2 in LE's but no clonus B/L in LE's.       Assessment & Plan:   Pt is a 66 yr old male with new T2 paraplegia due to very large syrinx- prior T6/T7 paraplegia- ASIA A complete paraplegia Injury 08/1996- industrial accident/Workers comp- boot strap got caught on in the mesh of steps- and threw him onto a platform- on a planer/lumbar stacker and burst T6/T7. Also has HTN and borderline DM. Also has neurogenic bowel and bladder and spasticity with pain pain and ITB pump.     Asked for Silvadene cream - usually get from Derm, but I'm happy to write for due to keeping skin form breakdown down, due to SCI- will write- 40 g apply BID with 5 refills  2. Cannot write for kenalog cream- cannot find in computer.    3. Wrote written Rx for hospital bed table- since his has become warped.  - likely 5 years or greater per wife. To replace current one that's an issue.     4. Refilled Zanaflex/Tizanidine on 10/19- for 6 months refill- 4 mg BID  5. Con't Amitiza  8 mg up to 3-4x/day- #120- with refills.   6. Pain meds via Dr Mechele Dawley   7. Episodes that he describes are at level SCI pain which can be exacerbated by  resp issues, constipation, bladder distension, etc- - then suggest using nebs- due to bronchitis/pneumonia.   8. Due to cognitive issues, cannot use Inhalers as easily (takes a high level of cognition to do inhalers) - would benefit from  using nebs due to bronchitis/pneumonia issues and impaired cognition due age?  9. Don't have Lucianne Lei to get him to appointments- via worker's comp- will see if can get Lucianne Lei for SCI support group- since will educate pt on new PT/OT adaptive equipment, driving. However would benefit from having a Lucianne Lei, since prior to his injury, he was able to see family, go to store, etc. Also would strongly benefit form going to SCI support group- - it's last Thursday of each month at 6-7 pm- at [place]-   10. Doesn't have Lucianne Lei- to transport pt- see #9- would benefit from [place], since it's common for worker's comp to help SCI patient have transportation.   11. F/U in 3 months- double appt SCI   12. Needs new charger for W/C- not charging- spoke with [employer]- he will deal with it.   I spent a total of 42   minutes on total care today- >50% coordination of care- due to speaking with pt/worker's comp staff and discussing SCI support group- as well as bowels and at level SCI pain.

## 2021-11-21 NOTE — Patient Instructions (Signed)
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 Also has HTN and borderline DM. Also has neurogenic bowel and bladder and spasticity with pain pain and ITB pump.     Asked for Silvadene cream - usually get from Derm, but I'm happy to write for due to keeping skin form breakdown down, due to SCI- will write- 40 g apply BID with 5 refills  2. Cannot write for kenalog cream- cannot find in computer.    3. Wrote written Rx for hospital bed table- since his has become warped.  - likely 5 years or greater per wife. To replace current one that's an issue.    4. Refilled Zanaflex/Tizanidine on 10/19- for 6 months refill- 4 mg BID  5. Con't Amitiza  8 mg up to 3-4x/day- #120- with refills.   6. Pain meds via Dr Mechele Dawley   7. Episodes that he describes are at level SCI pain which can be exacerbated by  resp issues, constipation, bladder distension, etc- - then suggest using nebs- due to bronchitis/pneumonia.   8. Due to cognitive issues, cannot use Inhalers as easily (takes a high level of cognition to do inhalers) - would benefit from using nebs due to bronchitis/pneumonia issues and impaired cognition due age?  9. Don't have Lucianne Lei to get him to appointments- via worker's comp- will see if can get Lucianne Lei for SCI support group- since will educate pt on new PT/OT adaptive equipment, driving. However would benefit from having a Lucianne Lei, since prior to his injury, he was able to see family, go to store, etc. Also would strongly benefit form going to SCI support group- - it's last Thursday of each month at 6-7 pm- at [place]-   10. Doesn't have Lucianne Lei- to transport pt- see #9- would benefit from [place], since it's common for worker's comp to help SCI  patient have transportation.   11. F/U in 3 months- double appt SCI

## 2022-01-05 ENCOUNTER — Other Ambulatory Visit: Payer: Self-pay | Admitting: Physical Medicine and Rehabilitation

## 2022-01-05 ENCOUNTER — Telehealth: Payer: Self-pay

## 2022-01-05 MED ORDER — MAGNESIUM CITRATE PO SOLN: 1.0000 | Freq: Once | ORAL | 3 refills | Status: AC

## 2022-01-05 NOTE — Telephone Encounter (Signed)
Mrs. Hoback is aware of Dr. Richmond Campbell advice.   Robert Mays has decided to call EMS for transport to the ER. Because his Urologist called about his hematuria & yeast. And he is now presenting with confusion.   She stated they will go to the ED &  follow up with Dr. Berline Chough in January.

## 2022-01-05 NOTE — Telephone Encounter (Signed)
Please advise Dr. Berline Chough is out of the office:  Robert Mays has not had a good bowel movement in 7-8 days. He has only passed small hard stools. Patient wife stated she has given him Amitiza 4 times daily, MOM, Senokot, and Suppositories. Nothing is working.  Robert Mays wanted to know if another medication can be sent  to the pharmacy?   (Paraplegia patient)  [facility] to [facility] [place].   Call back phone [phone].

## 2022-02-07 NOTE — Progress Notes (Signed)
Subjective:  Reason for Infectious Disease Consult: recurrent UTI's  Requesting Physician: Terrilee Files, MD  PCP: Conley Simmonds, MD  Urologist: Dr. McDiarmid   Patient ID: Robert Mays, male    DOB: 1955-05-07, 67 y.o.   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  He has also suffered consequent neurogenic bladder with ncontinence, and spasciticity. He also has multiple kidney stones that hass undergone multiple interventions with Urology.   He has suffered recurrent urinary tract infections and according to the patient and his wife the majority of the recent ones have all been fungal urinary tract infections.   One of the great difficulty of course for this patient is that he does not have much in the way of symptoms until his infections have progressed.  His wife says that once he starts becoming a little bit more fatigued and lethargic it progresses fairly rapidly.  We discussed that typically the treatment for fungal infection of the bladder is exchange of the catheter.  However it sounds like he is getting systemically ill prior to this being something that is possible.  It also sounds as if he is getting fungal infections because he is given frequent antibiotics for recurrent pneumonias due to his paralysis and inability to fully ventilate his lungs.  Has his catheter to exchanged twice per month by urology currently.   I do NOT have access to his records from and off health and we will request release of information from [facility].  Recent cultures with [facility] include cultures from December 19 which had 16,000 colony-forming units of yeast cultures from December 01, 2021 did not yield any organism cultures  from November 15, 2021 yielded 90,000 colony-forming's of diphtheroids cultures from November 03, 2021 yielded 10,000 colony-forming units of yeast cultures from December 20, 2021 yielded 100,000 colony-forming units of yeast cultures from October 05, 2021 yielded no organisms  Past Medical History:  Diagnosis Date   Cellulitis and abscess of left leg 07/2016   Chronic back pain    Depression    H/O spinal cord injury    Headache    Sepsis ([place]) 07/2016    Past Surgical History:  Procedure Laterality Date   ABDOMINAL SURGERY     BACK SURGERY     CYSTOSCOPY W/ URETERAL STENT PLACEMENT Right 02/22/2018   Procedure: CYSTOSCOPY, RIGHT RETROGRADE PYELOGRAM WITH URETERAL STENT PLACEMENT;  Surgeon: Bjorn Loser, MD;  Location: [facility];  Service: Urology;  Laterality: Right;   CYSTOSCOPY/URETEROSCOPY/HOLMIUM LASER/STENT PLACEMENT Right 03/18/2018   Procedure: CYSTOSCOPY/URETEROSCOPY/HOLMIUM LASER/STENT PLACEMENT;  Surgeon: Lucas Mallow, MD;  Location: [facility];  Service: Urology;  Laterality: Right;   INTRATHECAL PUMP IMPLANTATION Left 2000  ?   baclofin,clonidine,morphine infusion    Family History  Problem Relation Age of Onset   Heart attack Father        Died in his 63's      Social History   Socioeconomic History   Marital status: Married    Spouse name: Not on file   Number of children: Not on file   Years of education: Not on file   Highest education level: Not on file  Occupational History   Not on file  Tobacco Use  Smoking status: Never   Smokeless tobacco: Current    Types: Chew  Vaping Use   Vaping Use: Never used  Substance and Sexual Activity   Alcohol use: No   Drug use: No   Sexual activity: Not Currently  Other Topics Concern   Not on file  Social History Narrative   Not on file   Social Determinants of Health   Financial Resource Strain: Not on file  Food Insecurity: Not on file  Transportation Needs: Not on file   Physical Activity: Not on file  Stress: Not on file  Social Connections: Not on file    Allergies  Allergen Reactions   Sulfa Antibiotics Rash     Current Outpatient Medications:    albuterol (PROVENTIL) (2.5 MG/3ML) 0.083% nebulizer solution, Inhale into the lungs., Disp: , Rfl:    aspirin EC 81 MG tablet, Take 81 mg by mouth daily., Disp: , Rfl:    atorvastatin (LIPITOR) 10 MG tablet, Take 1 tablet by mouth daily., Disp: , Rfl:    baclofen (LIORESAL) 10 MG tablet, Take 1 tablet by mouth 3 (three) times daily as needed., Disp: , Rfl:    baclofen (LIORESAL) 20 MG tablet, Take 20 mg by mouth 3 (three) times daily., Disp: , Rfl: 0   cloNIDine (CATAPRES) 0.1 MG tablet, Take by mouth., Disp: , Rfl:    dicyclomine (BENTYL) 10 MG capsule, TAKE 1 CAPSULE BY MOUTH EVERY 6 HOURS ASNEEDED, Disp: , Rfl:    gabapentin (NEURONTIN) 300 MG capsule, Take by mouth., Disp: , Rfl:    gabapentin (NEURONTIN) 600 MG tablet, Take 600 mg by mouth 4 (four) times daily., Disp: , Rfl:    losartan (COZAAR) 50 MG tablet, Take 1 tablet by mouth daily., Disp: , Rfl:    lubiprostone (AMITIZA) 8 MCG capsule, Take 1 capsule (8 mcg total) by mouth 4 (four) times daily. For neurogenic bowel, Disp: 120 capsule, Rfl: 5   morphine 10 MG/5ML solution, Take by mouth every 2 (two) hours as needed for severe pain., Disp: , Rfl:    naloxone (NARCAN) nasal spray 4 mg/0.1 mL, Place 1 spray into the nose once., Disp: , Rfl:    nitrofurantoin, macrocrystal-monohydrate, (MACROBID) 100 MG capsule, Take 100 mg by mouth at bedtime., Disp: , Rfl:    omeprazole (PRILOSEC) 20 MG capsule, Take 20 mg by mouth daily., Disp: , Rfl:    ondansetron (ZOFRAN) 8 MG tablet, Take 8 mg by mouth every 8 (eight) hours as needed for nausea/vomiting., Disp: , Rfl:    Oxcarbazepine (TRILEPTAL) 300 MG tablet, Take 300 mg by mouth 2 (two) times daily., Disp: , Rfl:    oxybutynin (DITROPAN-XL) 10 MG 24 hr tablet, Take by mouth., Disp: , Rfl:    oxycodone  (ROXICODONE) 30 MG immediate release tablet, Take 30 mg by mouth every 6 (six) hours as needed for pain., Disp: , Rfl:    silver sulfADIAZINE (SILVADENE) 1 % cream, Apply 1 Application topically 2 (two) times daily. For buttocks to heal wounds-, Disp: 400 g, Rfl: 5   tamsulosin (FLOMAX) 0.4 MG CAPS capsule, Take 0.4 mg by mouth 3 (three) times daily., Disp: , Rfl:    tapentadol (NUCYNTA) 50 MG 12 hr tablet, Take by mouth., Disp: , Rfl:    tiZANidine (ZANAFLEX) 4 MG tablet, Take 1 tablet (4 mg total) by mouth in the morning and at bedtime., Disp: 180 tablet, Rfl: 1   venlafaxine (EFFEXOR) 75 MG tablet, Take 75 mg by mouth 2 (two) times daily.,  Disp: , Rfl:    Review of Systems  Constitutional:  Negative for chills and fever.  HENT:  Negative for congestion and sore throat.   Eyes:  Negative for photophobia.  Respiratory:  Negative for cough, shortness of breath and wheezing.   Cardiovascular:  Negative for chest pain, palpitations and leg swelling.  Gastrointestinal:  Negative for abdominal pain, blood in stool, constipation, diarrhea, nausea and vomiting.  Genitourinary:  Negative for dysuria, flank pain and hematuria.  Musculoskeletal:  Negative for back pain and myalgias.  Skin:  Negative for rash.  Neurological:  Positive for weakness. Negative for dizziness and headaches.  Hematological:  Does not bruise/bleed easily.  Psychiatric/Behavioral:  Negative for agitation, behavioral problems, confusion, hallucinations, sleep disturbance and suicidal ideas. The patient is not hyperactive.        Objective:   Physical Exam Constitutional:      Appearance: He is well-developed.  HENT:     Head: Normocephalic and atraumatic.  Eyes:     Conjunctiva/sclera: Conjunctivae normal.  Cardiovascular:     Rate and Rhythm: Normal rate and regular rhythm.  Pulmonary:     Effort: Pulmonary effort is normal. No respiratory distress.     Breath sounds: No wheezing.  Abdominal:     General: There  is no distension.     Palpations: Abdomen is soft.  Musculoskeletal:        General: No tenderness. Normal range of motion.     Cervical back: Normal range of motion and neck supple.  Skin:    General: Skin is warm and dry.     Coloration: Skin is not pale.     Findings: No erythema or rash.  Neurological:     General: No focal deficit present.     Mental Status: He is alert.  Psychiatric:        Mood and Affect: Mood normal.        Behavior: Behavior normal.        Thought Content: Thought content normal.        Judgment: Judgment normal.           Assessment & Plan:   Recurrent fungal infections of the bladder:  If he truly is having recurrent urinary tract infections during due to yeast which seems to be the story here I think the following things can be done to try to help his reduce hospitalizations is the following  #1 when he is prescribed an antibacterial antibiotic he is to take fluconazole 100mg  daily  #2 when he has the very beginnings of a UTI he is to start 200mg  of fluconazole and call Urology to have his catheter exchanged  Note I have given him prescriptions for fluconazole with 28 pills and I wanted to have fluconazole at home on hand at all times to cut down on the rate limiting steps on him getting antifungal therapy when he has a fungal urinary tract infection.  I would also suggest the possibility of him having a suprapubic catheter which he has wife said was never brought up to them before.  Recurrent pneumonias: he is seeing Dr. and she is helping him with flutter valve other maneuvers to try to help ventilate his lungs better.  Perhaps he would also benefit from recommendations from pulmonary.    I spent 82 minutes with the patient including than 50% of the time in face to face counseling of the patient and his wife regarding the nature of urinary tract infections fungal urinary  tract infections and how they are managed with catheter exchange but  in cases like him where the patient becomes systemically ill use of antifungals as well in particular if the catheter cannot be exchange immediately  along with review of medical records in preparation for the visit and during the visit and in coordination of his care.

## 2022-02-08 ENCOUNTER — Other Ambulatory Visit: Payer: Self-pay

## 2022-02-08 ENCOUNTER — Ambulatory Visit (INDEPENDENT_AMBULATORY_CARE_PROVIDER_SITE_OTHER): Payer: Worker's Compensation | Admitting: Infectious Disease

## 2022-02-08 ENCOUNTER — Telehealth: Payer: Self-pay

## 2022-02-08 ENCOUNTER — Encounter: Payer: Self-pay | Admitting: Infectious Disease

## 2022-02-08 VITALS — BP 130/85 | HR 96 | Temp 97.5°F | Ht 66.0 in | Wt 195.0 lb

## 2022-02-08 DIAGNOSIS — B3741 Candidal cystitis and urethritis: Secondary | ICD-10-CM | POA: Diagnosis not present

## 2022-02-08 DIAGNOSIS — J189 Pneumonia, unspecified organism: Secondary | ICD-10-CM

## 2022-02-08 DIAGNOSIS — N319 Neuromuscular dysfunction of bladder, unspecified: Secondary | ICD-10-CM

## 2022-02-08 DIAGNOSIS — Z9889 Other specified postprocedural states: Secondary | ICD-10-CM

## 2022-02-08 DIAGNOSIS — G8220 Paraplegia, unspecified: Secondary | ICD-10-CM | POA: Diagnosis not present

## 2022-02-08 DIAGNOSIS — K592 Neurogenic bowel, not elsewhere classified: Secondary | ICD-10-CM | POA: Diagnosis not present

## 2022-02-08 DIAGNOSIS — J40 Bronchitis, not specified as acute or chronic: Secondary | ICD-10-CM

## 2022-02-08 DIAGNOSIS — R252 Cramp and spasm: Secondary | ICD-10-CM

## 2022-02-08 DIAGNOSIS — G950 Syringomyelia and syringobulbia: Secondary | ICD-10-CM

## 2022-02-08 HISTORY — DX: Candidal cystitis and urethritis: B37.41

## 2022-02-08 MED ORDER — FLUCONAZOLE 100 MG PO TABS: ORAL_TABLET | ORAL | 5 refills | Status: DC

## 2022-02-08 NOTE — Telephone Encounter (Signed)
Faxed records request paperwork to [facility] (p: [phone], fax: [phone]) and to [facility] [phone], fax: [phone]).  Received electronic confirmation that faxes were received.  Records received today from [facility] and given to Dr. Tommy Medal. [facility] records still pending.  Binnie Kand, RN

## 2022-02-22 ENCOUNTER — Encounter
Payer: Worker's Compensation | Attending: Physical Medicine and Rehabilitation | Admitting: Physical Medicine and Rehabilitation

## 2022-02-22 ENCOUNTER — Encounter: Payer: Self-pay | Admitting: Physical Medicine and Rehabilitation

## 2022-02-22 VITALS — BP 126/84 | HR 105

## 2022-02-22 DIAGNOSIS — K592 Neurogenic bowel, not elsewhere classified: Secondary | ICD-10-CM

## 2022-02-22 DIAGNOSIS — Z993 Dependence on wheelchair: Secondary | ICD-10-CM

## 2022-02-22 DIAGNOSIS — G8220 Paraplegia, unspecified: Secondary | ICD-10-CM | POA: Diagnosis not present

## 2022-02-22 DIAGNOSIS — I951 Orthostatic hypotension: Secondary | ICD-10-CM

## 2022-02-22 DIAGNOSIS — G950 Syringomyelia and syringobulbia: Secondary | ICD-10-CM

## 2022-02-22 DIAGNOSIS — G894 Chronic pain syndrome: Secondary | ICD-10-CM | POA: Diagnosis not present

## 2022-02-22 MED ORDER — GUAIFENESIN ER 600 MG PO TB12: 600.0000 mg | ORAL_TABLET | Freq: Two times a day (BID) | ORAL | 1 refills | Status: DC | PRN

## 2022-02-22 MED ORDER — LUBIPROSTONE 24 MCG PO CAPS: 24.0000 ug | ORAL_CAPSULE | Freq: Two times a day (BID) | ORAL | 1 refills | Status: DC

## 2022-02-22 NOTE — Patient Instructions (Addendum)
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 Also has HTN and borderline DM. Also has neurogenic bowel and bladder and spasticity with pain pain and ITB pump.  Still on Nucynta and Oxycontin.    Will increase Amitiza to 24 mg 2x/day- for neurogenic bowel/constipation. Hasn't had a change in pain meds or ITB pump dose- sent in 3 months supply with 1 refill.  Save 8 mg tabs for future if needed, if bowels become looser.   2. In all SCI patients every 7-10 years, the bowels get more difficult to get them to go- so sounds like has reached that "time". So we need ot increase bowel meds.    3. Educated that ITB pump catheter is set up for paraplegia, not a high up/almost quad- so might not help arm spasms, due to location of catheter. If gets a lot worse, can ask them to move catheter higher which is surgical intervention.   4. Mucinex has guaifenesin in it- so helps him cough up  stuff- 300-600 mg daily- is over the counter, but should be covered by worker's comp, due to pneumonia frequency.  Will write Rx for 600 mg BID as needed- if starts to get pneumonia again, can increase it.   5. Talk to PCP about taking Losartan 25 mg daily instead of 50 mg daily- wait on Midodrine since can decrease Losartan.    6. Shower hasn't been made into a zero threshold shower- pending adjuster/modifications- first discussed 12/08/20 note.   7. Adjuster has approved a van per pt's wife- pending at this time  35.  Met with worker's comp nurse to discuss plan.   9. F/U in 3 months double appt- SCI  10 . Has Diflucan for yeast UTI- frequent

## 2022-02-22 NOTE — Progress Notes (Signed)
Subjective:    Patient ID: Robert Mays, male    DOB: January 16, 1956, 67 y.o.   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 Also has HTN and borderline DM. Also has neurogenic bowel and bladder and spasticity with pain pain and ITB pump.    Here for f/u on SCI.   Amitiza has just stopped working Having little dabs of bowels.  Has tried Milk of Mg, sennakot and miralax and prunes   2 weeks ago- had a blow out- x3.  Finally caught up to him.   Amitiza doing 16 mg 2x/day.   Still using arms, but had a couple of bad spells When lowered with hoyer lift, had pain radiate into arms Only time he complains, when lowered with hoyer lift.  Worst pain had in "lifetime" per pt.  Not sure if spasms- not nerve pain pt is clear about.  Every so often = not even every week.   He's had pneumonia in October and December-  Was told to give him Mucinex- giving daily, since had pneumonia I December- seems to help him.   Cannot have >40 oz/day due to sodium issue.    BP more labile-  Normal BP 120s/80s- which is regular for him.  92/54 at night- gets "real low" When laying down usually- so wasn't sitting up.   Sleep spells- wife thinks it's due to BP "way too low".  Always at night.  Still on Losartan 50 mg daily.   Adjuster sent out 2 people for quote for shower- hasn't been addressed.   Adjuster approved a van per pt's wife. .   Also had UTI in December- ~ 15th.  Pneumonia 01/19/22  Pain Inventory Average Pain 7 Pain Right Now 7 My pain is burning, stabbing, tingling, and aching  LOCATION OF PAIN  back, buttocks, hip, thigh, knee, leg, ankle, toes  BOWEL Number of stools per week: 1-2 Oral laxative use Yes  Type of  laxative amitiza-miralax-mog Enema or suppository use No  History of colostomy No  Incontinent No   BLADDER Foley In and out cath, frequency change of q2wk Able to self cath  . Bladder incontinence No  Frequent urination No  Leakage with coughing No  Difficulty starting stream No  Incomplete bladder emptying No    Mobility how many minutes can you walk? 0 ability to climb steps?  no do you drive?  no  Function disabled: date disabled 09/15/96 I need assistance with the following:  dressing, toileting, meal prep, household duties, and shopping  Neuro/Psych weakness numbness tremor tingling spasms confusion  Prior Studies Any changes since last visit?  no  Physicians involved in your care Any changes since last visit?  no   Family History  Problem Relation Age of Onset   Heart attack Father        Died in his 73's   Social History   Socioeconomic History   Marital status: Married    Spouse name: Not on file   Number of children: Not on file   Years of education: Not on file   Highest education level: Not on file  Occupational History   Not on file  Tobacco Use   Smoking status: Never  Smokeless tobacco: Current    Types: Chew  Vaping Use   Vaping Use: Never used  Substance and Sexual Activity   Alcohol use: No   Drug use: No   Sexual activity: Not Currently  Other Topics Concern   Not on file  Social History Narrative   Not on file   Social Determinants of Health   Financial Resource Strain: Not on file  Food Insecurity: Not on file  Transportation Needs: Not on file  Physical Activity: Not on file  Stress: Not on file  Social Connections: Not on file   Past Surgical History:  Procedure Laterality Date   [place] Right 02/22/2018   Procedure: CYSTOSCOPY, RIGHT RETROGRADE [place];  Surgeon: Bjorn Loser, MD;  Location: [facility];   Service: Urology;  Laterality: Right;   CYSTOSCOPY/URETEROSCOPY/HOLMIUM LASER/STENT PLACEMENT Right 03/18/2018   Procedure: CYSTOSCOPY/URETEROSCOPY/HOLMIUM LASER/STENT PLACEMENT;  Surgeon: Lucas Mallow, MD;  [facility];  Service: Urology;  Laterality: Right;   INTRATHECAL PUMP IMPLANTATION Left 2000  ?   baclofin,clonidine,morphine infusion   Past Medical History:  Diagnosis Date   [place] cystitis 02/08/2022   Cellulitis and abscess of left leg 07/2016   Chronic back pain    Depression    H/O spinal cord injury    Headache    Sepsis ([place]) 07/2016   BP 126/84   Pulse (!) 105   SpO2 92%   Opioid Risk Score:   Fall Risk Score:  `1  Depression screen [facility] 2/9     07/22/2021    9:42 AM 09/06/2020   10:08 AM  Depression screen PHQ 2/9  Decreased Interest 1 1  Down, Depressed, Hopeless 1 1  PHQ - 2 Score 2 2  Altered sleeping  1  Tired, decreased energy  3  Change in appetite  1  Feeling bad or failure about yourself   0  Trouble concentrating  2  Moving slowly or fidgety/restless  0  Suicidal thoughts  0  PHQ-9 Score  9      Review of Systems  All other systems reviewed and are negative.     Objective:   Physical Exam  Awake, alert, stable delayed responses; accompanied by wife, NAD In power w/c- mildly tilt sitting in place [place] lift sling on him.  Fiddling with hands "Cleaning nails".  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 Also has HTN and borderline DM. Also has neurogenic bowel and bladder and spasticity with pain pain and ITB pump. Orthostatic hypotension.  Still on Nucynta and Oxycontin.    Will  increase Amitiza to 24 mg 2x/day- for neurogenic bowel/constipation. Hasn't had a change in pain meds or ITB pump dose- sent in 3 months supply with 1 refill.  Save 8 mg tabs for future if needed, if bowels become looser.   2. In all SCI patients every 7-10 years, the bowels get more difficult to get them to go- so sounds like has reached that "time". So we need ot increase  bowel meds.    3. Educated that ITB pump catheter is set up for paraplegia, not a high up/almost quad- so might not help arm spasms, due to location of catheter. If gets a lot worse, can ask them to move catheter higher which is surgical intervention.   4. Mucinex has guaifenesin in it- so helps him cough up  stuff- 300-600 mg daily- is over the counter, but should be covered by worker's comp, due to pneumonia frequency.  Will write Rx for 600 mg BID as needed- if starts to get pneumonia again, can increase it.   5. Talk to PCP about taking Losartan 25 mg daily instead of 50 mg daily- wait on Midodrine since can decrease Losartan.    6. Shower hasn't been made into a zero threshold shower- pending adjuster/modifications- first discussed 12/08/20 note.   7. Adjuster has approved a van per pt's wife- pending at this time  56.  Met with worker's comp nurse to discuss plan.   9. F/U in 3 months double appt- SCI  10. Has Diflucan for yeast/UTI  I spent a total of 42   minutes on total care today- >50% coordination of care- due to d/w Worker's comp and d/w wife/pt- on BP, Cough/sputum and bowel issues

## 2022-03-01 ENCOUNTER — Telehealth: Payer: Self-pay | Admitting: Physical Medicine and Rehabilitation

## 2022-03-01 NOTE — Telephone Encounter (Signed)
Requesting new nebulizer. His current nebulizer no longer works  Please fax order [phone] fax

## 2022-03-03 NOTE — Telephone Encounter (Signed)
Gave rx to be faxed- thanks- ML

## 2022-04-18 NOTE — Progress Notes (Unsigned)
Subjective:   Chief complaint: followup for recurrent fungal UTI's   Patient ID: Robert Mays, male    DOB: [birth year], 67 y.o.   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  He has also suffered consequent neurogenic bladder with ncontinence, and spasciticity. He also has multiple kidney stones that hass undergone multiple interventions with Urology.   He has suffered recurrent urinary tract infections and according to the patient and his wife the majority of the recent ones have all been fungal urinary tract infections.   One of the great difficulty of course for this patient is that he does not have much in the way of symptoms until his infections have progressed.  His wife says that once he starts becoming a little bit more fatigued and lethargic it progresses fairly rapidly.  We discussed that typically the treatment for fungal infection of the bladder is exchange of the catheter.  However it sounds like he is getting systemically ill prior to this being something that is possible.  It also sounds as if he was  getting fungal infections because he is given frequent antibiotics for recurrent pneumonias due to his paralysis and inability to fully ventilate his lungs.  Has his catheter to exchanged twice per month by urology currently.   I did NOT have access to his records from and off health and we will request release of information from [place].  Recent cultures with [facility] include cultures from December 19 which had 16,000 colony-forming units of yeast cultures from December 01, 2021 did not yield any organism cultures from November 15, 2021 yielded 90,000 colony-forming's of diphtheroids cultures from November 03, 2021 yielded 10,000 colony-forming units of yeast cultures from December 20, 2021 yielded 100,000 colony-forming units of yeast cultures from October 05, 2021 yielded no organisms   f he truly is having recurrent urinary tract infections during due to yeast which seems to be the story here I t thought the following things can be done to try to help his reduce hospitalizations is the following  #1 when he is prescribed an antibacterial antibiotic he is to take fluconazole 100mg  daily  #2 when he has the very beginnings of a UTI he is to start 200mg  of fluconazole and call Urology to have his catheter exchanged  Note I had given him prescriptions for fluconazole with 28 pills and I wanted to have fluconazole at home on hand at all times to cut down on the rate limiting steps on him getting antifungal therapy when he has a fungal urinary tract infection.  I would also suggest the possibility of him having a suprapubic catheter which he has wife said was never brought up to them before.  He is working with Dr Dagoberto Ligas to reduce risk of aspiration pneumonia.  Since I last saw Robert Mays he has been given the 28 days of fluconazole as mentioned he is also been given several courses of Consul after having received antibacterial therapy.  On 1 instance he had a soft tissue infection adjacent to his baclofen pump where he had been scratching himself and was given clindamycin.  More recently he had cultures done at [facility] urology showing Enterococcus faecalis as well as a Citrobacter see below.  Is not clear to me that he had symptoms though this is difficult to assess in context of his paraplegia.  He was given antibacterial antibiotics followed by antifungals yet again. His wife is paying attention to the smell and appearance of his odor--which while this may have merit is not something that we can rely on medically.     Past Medical History:  Diagnosis Date   Candida cystitis  02/08/2022   Cellulitis and abscess of left leg 07/2016   Chronic back pain    Depression    H/O spinal cord injury    Headache    Sepsis ([place]) 07/2016    Past Surgical History:  Procedure Laterality Date   ABDOMINAL SURGERY     BACK SURGERY     CYSTOSCOPY W/ URETERAL STENT PLACEMENT Right 02/22/2018   Procedure: CYSTOSCOPY, RIGHT RETROGRADE PYELOGRAM WITH URETERAL STENT PLACEMENT;  Surgeon: Bjorn Loser, MD;  Location: [facility];  Service: Urology;  Laterality: Right;   CYSTOSCOPY/URETEROSCOPY/HOLMIUM LASER/STENT PLACEMENT Right 03/18/2018   Procedure: CYSTOSCOPY/URETEROSCOPY/HOLMIUM LASER/STENT PLACEMENT;  Surgeon: Lucas Mallow, MD;  Location: [facility];  Service: Urology;  Laterality: Right;   INTRATHECAL PUMP IMPLANTATION Left 2000  ?   baclofin,clonidine,morphine infusion    Family History  Problem Relation Age of Onset   Heart attack Father        Died in his 24's      Social History   Socioeconomic History   Marital status: Married    Spouse name: Not on file   Number of children: Not on file   Years of education: Not on file   Highest education level: Not on file  Occupational History   Not on file  Tobacco Use   Smoking status: Never   Smokeless tobacco: Current    Types: Chew  Vaping Use   Vaping Use: Never used  Substance and Sexual Activity   Alcohol use: No   Drug use: No   Sexual activity: Not Currently  Other Topics Concern   Not on file  Social History Narrative   Not on file   Social Determinants of Health   Financial Resource Strain: Not on file  Food Insecurity: Not on file  Transportation Needs: Not on file  Physical Activity: Not on file  Stress: Not on file  Social Connections: Not on file    Allergies  Allergen Reactions   Sulfa Antibiotics Rash     Current Outpatient Medications:    albuterol (PROVENTIL) (2.5 MG/3ML) 0.083% nebulizer solution, Inhale into the lungs., Disp: , Rfl:    aspirin EC 81 MG  tablet, Take 81 mg by mouth daily., Disp: , Rfl:    atorvastatin (LIPITOR) 10 MG tablet, Take 1 tablet by mouth daily., Disp: , Rfl:    baclofen (LIORESAL) 10 MG tablet, Take 1 tablet by mouth 3 (three) times daily as needed., Disp: , Rfl:    baclofen (LIORESAL) 20 MG tablet, Take 20 mg by mouth 3 (three) times daily., Disp: , Rfl: 0   BACLOFEN IT, 414.12 mcg by Intrathecal route daily. Via intrathecal pain pump, Disp: , Rfl:    cloNIDine HCl, Analgesia, (CLONIDINE HCL, BULK,) 250 MG/50ML SOLN, 92,003 mcg by Does not apply route daily. Given via intrathecal pain pump, Disp: , Rfl:    dicyclomine (BENTYL) 10 MG capsule, TAKE 1 CAPSULE BY MOUTH EVERY 6 HOURS ASNEEDED, Disp: , Rfl:    fluconazole (DIFLUCAN) 100 MG tablet, Take 2 tabs x 14 days for UTI and have  catheter changed by urology asap Take 1 tab daily while on antibacterial antibiotics (Patient not taking: Reported on 02/22/2022), Disp: 28 tablet, Rfl: 5   gabapentin (NEURONTIN) 300 MG capsule, Take by mouth., Disp: , Rfl:    gabapentin (NEURONTIN) 600 MG tablet, Take 600 mg by mouth 4 (four) times daily., Disp: , Rfl:    guaiFENesin (MUCINEX) 600 MG 12 hr tablet, Take 1 tablet (600 mg total) by mouth 2 (two) times daily as needed., Disp: 180 tablet, Rfl: 1   losartan (COZAAR) 50 MG tablet, Take 1 tablet by mouth daily., Disp: , Rfl:    lubiprostone (AMITIZA) 24 MCG capsule, Take 1 capsule (24 mcg total) by mouth 2 (two) times daily with a meal. For severe neurogenic bowel/constipation and opioid induced constipaiton, Disp: 180 capsule, Rfl: 1   morphine 10 MG/5ML solution, Take by mouth every 2 (two) hours as needed for severe pain. Per patient, taking 1.804 mg/day via intrathecal pain pump, Disp: , Rfl:    naloxone (NARCAN) nasal spray 4 mg/0.1 mL, Place 1 spray into the nose once. (Patient not taking: Reported on 02/08/2022), Disp: , Rfl:    nitrofurantoin, macrocrystal-monohydrate, (MACROBID) 100 MG capsule, Take 100 mg by mouth at bedtime.,  Disp: , Rfl:    omeprazole (PRILOSEC) 20 MG capsule, Take 20 mg by mouth daily., Disp: , Rfl:    ondansetron (ZOFRAN) 8 MG tablet, Take 8 mg by mouth every 8 (eight) hours as needed for nausea/vomiting., Disp: , Rfl:    Oxcarbazepine (TRILEPTAL) 300 MG tablet, Take 300 mg by mouth 3 (three) times daily., Disp: , Rfl:    oxybutynin (DITROPAN-XL) 10 MG 24 hr tablet, Take 20 mg by mouth., Disp: , Rfl:    oxycodone (ROXICODONE) 30 MG immediate release tablet, Take 30 mg by mouth 2 (two) times daily., Disp: , Rfl:    Probiotic Product (PROBIOTIC DAILY PO), Take by mouth., Disp: , Rfl:    silver sulfADIAZINE (SILVADENE) 1 % cream, Apply 1 Application topically 2 (two) times daily. For buttocks to heal wounds-, Disp: 400 g, Rfl: 5   tapentadol (NUCYNTA) 50 MG 12 hr tablet, Take by mouth every 12 (twelve) hours., Disp: , Rfl:    tiZANidine (ZANAFLEX) 4 MG tablet, Take 1 tablet (4 mg total) by mouth in the morning and at bedtime., Disp: 180 tablet, Rfl: 1   triamcinolone (KENALOG) 0.025 % cream, Use 2 x/day for inflammation, Disp: , Rfl:    venlafaxine (EFFEXOR) 75 MG tablet, Take 75 mg by mouth 2 (two) times daily., Disp: , Rfl:    Review of Systems  Constitutional:  Negative for activity change, appetite change, chills, diaphoresis, fatigue, fever and unexpected weight change.  HENT:  Negative for congestion, rhinorrhea, sinus pressure, sneezing, sore throat and trouble swallowing.   Eyes:  Negative for photophobia and visual disturbance.  Respiratory:  Negative for cough, chest tightness, shortness of breath, wheezing and stridor.   Cardiovascular:  Negative for chest pain, palpitations and leg swelling.  Gastrointestinal:  Negative for abdominal distention, abdominal pain, anal bleeding, blood in stool, constipation, diarrhea, nausea and vomiting.  Genitourinary:  Negative for difficulty urinating, dysuria, flank pain and hematuria.  Musculoskeletal:  Negative for arthralgias, back pain, gait  problem, joint swelling and myalgias.  Skin:  Negative for color change, pallor, rash and wound.  Neurological:  Positive for weakness. Negative for dizziness, tremors and light-headedness.  Hematological:  Negative for adenopathy. Does not bruise/bleed easily.  Psychiatric/Behavioral:  Negative for agitation, behavioral problems, confusion, decreased  concentration, dysphoric mood and sleep disturbance.        Objective:   Physical Exam Constitutional:      Appearance: He is well-developed.  HENT:     Head: Normocephalic and atraumatic.  Eyes:     Conjunctiva/sclera: Conjunctivae normal.  Cardiovascular:     Rate and Rhythm: Normal rate and regular rhythm.  Pulmonary:     Effort: Pulmonary effort is normal. No respiratory distress.     Breath sounds: No wheezing.  Abdominal:     General: There is no distension.     Palpations: Abdomen is soft.  Musculoskeletal:        General: No tenderness. Normal range of motion.     Cervical back: Normal range of motion and neck supple.  Skin:    General: Skin is warm and dry.     Coloration: Skin is not pale.     Findings: No erythema or rash.  Neurological:     Mental Status: He is alert and oriented to person, place, and time.  Psychiatric:        Attention and Perception: Attention normal.        Speech: Speech is delayed.        Behavior: Behavior normal.        Thought Content: Thought content normal.           Assessment & Plan:  Current fungal infections of the bladder:  Will continue to have him use antifungals after antibacterial therapy.  Hopefully his ended I will not develop Azle resistance.  He should have his catheter exchange anytime he has a yeast infection of the catheter.  Recurrent UTIs: This is  a situation where cultures are ONLY full in the context of symptoms.  I wonder if he really would benefit from a suprapubic catheter.   Soft tissue infection around baclofen pump: resolved  Recurrent  pneumonias: he is seeing Dr. Dagoberto Ligas and she is helping him with flutter valve other maneuvers to try to help ventilate his lungs better.  Perhaps he would also benefit from recommendations from pulmonary.

## 2022-04-19 ENCOUNTER — Other Ambulatory Visit: Payer: Self-pay

## 2022-04-19 ENCOUNTER — Ambulatory Visit (INDEPENDENT_AMBULATORY_CARE_PROVIDER_SITE_OTHER): Payer: Worker's Compensation | Admitting: Infectious Disease

## 2022-04-19 ENCOUNTER — Encounter: Payer: Self-pay | Admitting: Infectious Disease

## 2022-04-19 VITALS — BP 111/72 | HR 89 | Resp 16 | Ht 66.0 in | Wt 195.0 lb

## 2022-04-19 DIAGNOSIS — G8220 Paraplegia, unspecified: Secondary | ICD-10-CM | POA: Diagnosis not present

## 2022-04-19 DIAGNOSIS — L03116 Cellulitis of left lower limb: Secondary | ICD-10-CM

## 2022-04-19 DIAGNOSIS — G950 Syringomyelia and syringobulbia: Secondary | ICD-10-CM

## 2022-04-19 DIAGNOSIS — J40 Bronchitis, not specified as acute or chronic: Secondary | ICD-10-CM | POA: Diagnosis not present

## 2022-04-19 DIAGNOSIS — L03311 Cellulitis of abdominal wall: Secondary | ICD-10-CM

## 2022-04-19 DIAGNOSIS — B3741 Candidal cystitis and urethritis: Secondary | ICD-10-CM

## 2022-04-19 DIAGNOSIS — J189 Pneumonia, unspecified organism: Secondary | ICD-10-CM

## 2022-04-19 DIAGNOSIS — J690 Pneumonitis due to inhalation of food and vomit: Secondary | ICD-10-CM

## 2022-04-19 MED ORDER — FLUCONAZOLE 100 MG PO TABS: ORAL_TABLET | ORAL | 5 refills | Status: DC

## 2022-05-22 ENCOUNTER — Encounter
Payer: Worker's Compensation | Attending: Physical Medicine and Rehabilitation | Admitting: Physical Medicine and Rehabilitation

## 2022-05-22 ENCOUNTER — Encounter: Payer: Self-pay | Admitting: Physical Medicine and Rehabilitation

## 2022-05-22 VITALS — BP 113/75 | HR 84 | Wt 195.0 lb

## 2022-05-22 DIAGNOSIS — G8220 Paraplegia, unspecified: Secondary | ICD-10-CM | POA: Diagnosis present

## 2022-05-22 DIAGNOSIS — Z978 Presence of other specified devices: Secondary | ICD-10-CM | POA: Diagnosis present

## 2022-05-22 DIAGNOSIS — J690 Pneumonitis due to inhalation of food and vomit: Secondary | ICD-10-CM

## 2022-05-22 DIAGNOSIS — K592 Neurogenic bowel, not elsewhere classified: Secondary | ICD-10-CM | POA: Insufficient documentation

## 2022-05-22 DIAGNOSIS — G950 Syringomyelia and syringobulbia: Secondary | ICD-10-CM | POA: Diagnosis present

## 2022-05-22 DIAGNOSIS — Z993 Dependence on wheelchair: Secondary | ICD-10-CM | POA: Insufficient documentation

## 2022-05-22 MED ORDER — TIZANIDINE HCL 4 MG PO TABS: 4.0000 mg | ORAL_TABLET | Freq: Two times a day (BID) | ORAL | 1 refills | Status: DC

## 2022-05-22 MED ORDER — SILVER SULFADIAZINE 1 % EX CREA: 1.0000 | TOPICAL_CREAM | Freq: Two times a day (BID) | CUTANEOUS | 5 refills | Status: DC

## 2022-05-22 MED ORDER — LUBIPROSTONE 24 MCG PO CAPS: 24.0000 ug | ORAL_CAPSULE | Freq: Two times a day (BID) | ORAL | 5 refills | Status: DC

## 2022-05-22 NOTE — Progress Notes (Signed)
Subjective:    Patient ID: Robert Mays, male    DOB: [birth year], 67 y.o.   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 Also has HTN and borderline DM. Also has neurogenic bowel and bladder and spasticity with pain pain and ITB pump.    Here for f/u on SCI.   Things doing OK.   BP concerning wife - can "bottom out"- concerns-  Has been as low as 72/40 when went to hospital.    Was in hospital 2-3 weeks ago- for UTI- went to [facility]. Monday to Wednesday PM BP was 200 systolic that day and dropping- when Ambulance got there, was 72/40-    Goes to sleep 2-3 am and sleeps late regularly-   When goes to eat- cannot get W/C under the table-  Arm is broken- R arm Canvas back is wearing down.  Manual w/c-   [facility] is who they got power w/c with.   Cannot get bedside table around him when in power w/c- so uses manual w/c to shave, etc.   Hoyer lift hasn't been checked. Wife concerned that hasn't in a few years.   Bowels- increased Amitiza  works when also gives Prunes- several times/week- 3x/week gives Prunes. Likes prunes.   Was "going a lot- this last week, but has resolved.  Just Aimitiza alone, needs a little help.   C/O pain in chest all the way down his body-  Once in awhile- when lowered in hoyer lift and rarely otherwise.   ITB pump- last filled 1/24- goes in the next 2 weeks to get pump filled- Dr Roderic Ovens.   Pain Inventory Average Pain 7 Pain Right Now 7 My pain is sharp, burning, and aching  LOCATION OF PAIN  back buttocks groin hip thig knee leg ankle toes  BOWEL Number of stools per week: 6-10 Oral laxative use Yes  Type of laxative amitiza  BLADDER Foley  Mobility use  a wheelchair needs help with transfers  Function disabled: date disabled 09/15/96 I need assistance with the following:  dressing, bathing, toileting, meal prep, household duties, and shopping  Neuro/Psych weakness numbness spasms confusion depression  Prior Studies Any changes since last visit?  no  Physicians involved in your care Pain Dr Roderic Ovens [facility]   Family History  Problem Relation Age of Onset   Heart attack Father        Died in his 73's   Social History   Socioeconomic History   Marital status: Married    Spouse name: Not on file   Number of children: Not on file   Years of education: Not on file   Highest education level: Not on file  Occupational History   Not on file  Tobacco Use   Smoking status: Never    Passive exposure: Never   Smokeless tobacco: Current    Types: Chew  Vaping Use   Vaping Use: Never used  Substance and Sexual Activity   Alcohol use: No   Drug use: No   Sexual activity: Not Currently  Other Topics Concern   Not on file  Social History Narrative   Not on file   Social Determinants of Health   Financial Resource  Strain: Not on file  Food Insecurity: Not on file  Transportation Needs: Not on file  Physical Activity: Not on file  Stress: Not on file  Social Connections: Not on file   Past Surgical History:  Procedure Laterality Date   ABDOMINAL SURGERY     BACK SURGERY     CYSTOSCOPY W/ URETERAL STENT PLACEMENT Right 02/22/2018   Procedure: CYSTOSCOPY, RIGHT RETROGRADE PYELOGRAM WITH URETERAL STENT PLACEMENT;  Surgeon: Alfredo Martinez, MD;  Location: [facility];  Service: Urology;  Laterality: Right;   CYSTOSCOPY/URETEROSCOPY/HOLMIUM LASER/STENT PLACEMENT Right 03/18/2018   Procedure: CYSTOSCOPY/URETEROSCOPY/HOLMIUM LASER/STENT PLACEMENT;  Surgeon: Crista Elliot, MD;  [facility];  Service: Urology;  Laterality: Right;   INTRATHECAL PUMP IMPLANTATION Left 2000  ?    baclofin,clonidine,morphine infusion   Past Medical History:  Diagnosis Date   Candida cystitis 02/08/2022   Cellulitis and abscess of left leg 07/2016   Chronic back pain    Depression    H/O spinal cord injury    Headache    Sepsis ([facility]) 07/2016   BP 113/75   Pulse 84   Wt 195 lb (88.5 kg) Comment: last recorded  SpO2 96%   BMI 31.47 kg/m   Opioid Risk Score:   Fall Risk Score:  `1  Depression screen [facility] 2/9     05/22/2022    9:10 AM 04/19/2022   10:37 AM 07/22/2021    9:42 AM 09/06/2020   10:08 AM  Depression screen [facility] 2/9  Decreased Interest 1 1 1 1   Down, Depressed, Hopeless 1 0 1 1  PHQ - 2 Score 2 1 2 2   Altered sleeping    1  Tired, decreased energy    3  Change in appetite    1  Feeling bad or failure about yourself     0  Trouble concentrating    2  Moving slowly or fidgety/restless    0  Suicidal thoughts    0  PHQ-9 Score    9    Review of Systems  Constitutional: Negative.   HENT: Negative.    Eyes: Negative.   Respiratory:  Positive for cough.   Cardiovascular:  Positive for leg swelling.  Gastrointestinal:  Positive for nausea.  Endocrine: Negative.   Genitourinary:        Foley  Musculoskeletal:  Positive for myalgias.       Spasm  Skin: Negative.   Allergic/Immunologic: Negative.   Neurological:  Positive for weakness and numbness.  Hematological: Negative.   Psychiatric/Behavioral:  Positive for confusion and dysphoric mood.   All other systems reviewed and are negative.      Objective:   Physical Exam Awake, alert, appropriate, poor memory; in power w/c; in mild tilt; accompanied by wife, NAD Has foley bag- chronic foley- Light amber urine in bag MS;  UE strength 5/5 in B/L arms- biceps, triceps, WE, grip and FA B/L    Neuro: MAS of 1 to 1+ in LE's B/L- 2-3 beats clonus RLE- none in LLE Sensation decreased from T2 downwards- no change       Assessment & Plan:   Pt is a 67 yr old male with new T2 paraplegia due to very  large syrinx- prior T6/T7 paraplegia- ASIA A complete paraplegia Injury 08/1996- industrial accident/Workers comp- boot strap got caught on in the mesh of steps- and threw him onto a platform- on a Pharmacist, community and burst T6/T7. Also has HTN and borderline DM. Also has neurogenic bowel and  bladder and spasticity with pain pain and ITB pump.  Also has syrinx.    Here for f/u on SCI.    Only get concerned if low BP <85/50s- when not mentating well.   2. Don't use electronic devices 1 hour before goes to sleep. Helps with sleep hygiene.    3.  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 - didn't have any anxiety, HA, so not clear if AD or not-  Occurs when has painful stimulus below level of lesion/SCI- and can have severely bad Headache, nasal congestion, anxiety or sweating in shoulders/neck, face. 2 of those So if he has these Symptoms- >20 minutes and BP >190 as upper number, then call EMS-  If goes to ER, then take this with you to remind them- to treat, treat the cause of painful stimulus- don't treat with regular BP meds- treat with nitropaste or nitroglycerin which gives time to treat the painful stimulus. This is not just Hypertension.    4. Needs Manual w/c repairs- for R arm and canvas back- they don't know who got it from.  Gave rx to fix or to replace.   5.  Suggest can call - for [employer]- Ramond Dial- to get it maintained-  but I don't normally have patients get regular maintenance - wife wondering if should be checked. Wrote Rx.   6. Con't Amitiza -sent in 30 days supply with 5 refills per wife request.   7. They decreased Losartan to 25 mg daily- which is better for pt. Per PCP.   8. Shower is still not done zero threshold. Needs to be done- has been an issue since pt started to come to me in 2022.  Rosalita Chessman the shower- has approved a quote.   9. Met with Worker's comp Sports coach to discuss plan.   10. Hasn't heard anything  about [employer] Niece for transportation. Hopefully this will get cleared soon. [employer]ena[employer] Niece has approved- but trying to get figured out with maintenance.    11. Due to get ITB pump refilled    12. Still using Diflucan as needed for thrush.    13. F/U in 3 months- double visit  14. Con't Zanaflex 4 mg BID 3 months supply 1 refill  15. Con't Silvadene- wrote for refills  16. Filled out handicapped placard  I spent a total of  43  minutes on total care today- >50% coordination of care- due to discussion of AD, hypotension, sleep hygiene;  And refills and d/w Anisa, worker's comp case mgr.

## 2022-05-22 NOTE — Patient Instructions (Addendum)
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 Also has HTN and borderline DM. Also has neurogenic bowel and bladder and spasticity with pain pain and ITB pump.  Also has syrinx.    Here for f/u on SCI.    Only get concerned if low BP <85/50s- when not mentating well.   2. Don't use electronic devices 1 hour before goes to sleep. Helps with sleep hygiene.    3.  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 - didn't have any anxiety, HA, so not clear if AD or not-  Occurs when has painful stimulus below level of lesion/SCI- and can have severely bad Headache, nasal congestion, anxiety or sweating in shoulders/neck, face. 2 of those So if he has these Symptoms- >20 minutes and BP >190 as upper number, then call EMS-  If goes to ER, then take this with you to remind them- to treat, treat the cause of painful stimulus- don't treat with regular BP meds- treat with nitropaste or nitroglycerin which gives time to treat the painful stimulus. This is not just Hypertension.    4. Needs Manual w/c repairs- for R arm and canvas back- they don't know who got it from.    5.  Suggest can call - for [employer]- [employer]ond Dial- to get it maintained-  but I don't normally have patients get regular maintenance - wife wondering if should be checked.   6. Con't Amitiza   7. They decreased Losartan to 25 mg daily- which is better for pt. Per PCP.   8. Shower is still not done zero threshold. Needs to be done- has been an issue since pt started to come to me in 2022.  Rosalita Chessman the shower- has approved a quote.   9. Met with Worker's comp Sports coach to discuss plan.   10. Hasn't heard anything about [employer] Niece for transportation. Hopefully  this will get cleared soon. Zenaida Niece has approved- but trying to get figured out with maintenance.    11. Due to get ITB pump refilled    12. Still using Diflucan as needed for thrush.    13. F/U in 3 months- double visit  14. Con't Zanaflex 4 mg BID 3 months supply 1 refill  15. Con't Silvadene- wrote for refills

## 2022-06-13 NOTE — Progress Notes (Unsigned)
Subjective:   Chief complaint: Follow-up for recurrent urinary tract infections at times with yeast  Patient ID: Robert Mays, male    DOB: 21-Mar-1955, 67 y.o.   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  He has also suffered consequent neurogenic bladder with ncontinence, and spasciticity. He also has multiple kidney stones that hass undergone multiple interventions with Urology.   He has suffered recurrent urinary tract infections and according to the patient and his wife the majority of the recent ones have all been fungal urinary tract infections.   One of the great difficulty of course for this patient is that he does not have much in the way of symptoms until his infections have progressed.  His wife says that once he starts becoming a little bit more fatigued and lethargic it progresses fairly rapidly.  We discussed that typically the treatment for fungal infection of the bladder is exchange of the catheter.  However it sounds like he is getting systemically ill prior to this being something that is possible.  It also sounds as if he was  getting fungal infections because he is given frequent antibiotics for recurrent pneumonias due to his paralysis and inability to fully ventilate his lungs.  Has his catheter to exchanged twice per month by urology currently.   I did NOT have access to his records from and off health and we will request release of information from Kim.  Recent cultures with [facility] include cultures from December 19 which had 16,000 colony-forming units of yeast cultures from December 01, 2021 did not yield any organism cultures from November 15, 2021 yielded 90,000 colony-forming's of  diphtheroids cultures from November 03, 2021 yielded 10,000 colony-forming units of yeast cultures from December 20, 2021 yielded 100,000 colony-forming units of yeast cultures from October 05, 2021 yielded no organisms   f he truly is having recurrent urinary tract infections during due to yeast which seems to be the story here I t thought the following things can be done to try to help his reduce hospitalizations is the following  #1 when he is prescribed an antibacterial antibiotic he is to take fluconazole 100mg  daily  #2 when he has the very beginnings of a UTI he is to start 200mg  of fluconazole and call Urology to have his catheter exchanged  Note I had given him prescriptions for fluconazole with 28 pills and I wanted to have fluconazole at home on hand at all times to cut down on the rate limiting steps on him getting antifungal therapy when he has a fungal urinary tract infection.  I would also suggest the possibility of him having a suprapubic catheter which he has wife said was never brought up to them before.  He is working with Dr Robert Mays to reduce risk of aspiration pneumonia.  Since I last saw Robert Mays he has been given the 28 days of fluconazole as mentioned he is also been given several courses of  of fluconazole after having received antibacterial therapy.  On 1 instance he had a soft tissue infection adjacent to his baclofen pump where he had been scratching himself and was given clindamycin.  More recently he had cultures done at [facility] showing Enterococcus faecalis as well  as a Citrobacter see below.       Is not clear to me that he had symptoms though this is difficult to assess in context of his paraplegia.  He was given antibacterial antibiotics followed by antifungals yet again.    I last saw him he had urinary tract infection diagnosed with [facility] and was prescribed Macrobid and then a second antibiotic but failed to improve and deteriorated  and ultimately was hospitalized at [facility] where he received IV antibiotics improvement in his symptoms.  He is having his Foley catheter changed every 2 weeks.       Past Medical History:  Diagnosis Date   Candida cystitis 02/08/2022   Cellulitis and abscess of left leg 07/2016   Chronic back pain    Depression    H/O spinal cord injury    Headache    Sepsis (HCC) 07/2016    Past Surgical History:  Procedure Laterality Date   ABDOMINAL SURGERY     BACK SURGERY     CYSTOSCOPY W/ URETERAL STENT PLACEMENT Right 02/22/2018   Procedure: CYSTOSCOPY, RIGHT RETROGRADE PYELOGRAM WITH URETERAL STENT PLACEMENT;  Surgeon: Robert Martinez, MD;  Location: [facility];  Service: Urology;  Laterality: Right;   CYSTOSCOPY/URETEROSCOPY/HOLMIUM LASER/STENT PLACEMENT Right 03/18/2018   Procedure: CYSTOSCOPY/URETEROSCOPY/HOLMIUM LASER/STENT PLACEMENT;  Surgeon: Robert Elliot, MD;  Location: [facility];  Service: Urology;  Laterality: Right;   INTRATHECAL PUMP IMPLANTATION Left 2000  ?   baclofin,clonidine,morphine infusion    Family History  Problem Relation Age of Onset   Heart attack Father        Died in his 14's      Social History   Socioeconomic History   Marital status: Married    Spouse name: Not on file   Number of children: Not on file   Years of education: Not on file   Highest education level: Not on file  Occupational History   Not on file  Tobacco Use   Smoking status: Never    Passive exposure: Never   Smokeless tobacco: Current    Types: Chew  Vaping Use   Vaping Use: Never used  Substance and Sexual Activity   Alcohol use: No   Drug use: No   Sexual activity: Not Currently  Other Topics Concern   Not on file  Social History Narrative   Not on file   Social Determinants of Health   Financial Resource Strain: Not on file  Food Insecurity: Not on file  Transportation Needs: Not on file  Physical Activity: Not on file  Stress:  Not on file  Social Connections: Not on file    Allergies  Allergen Reactions   Sulfa Antibiotics Rash     Current Outpatient Medications:    albuterol (PROVENTIL) (2.5 MG/3ML) 0.083% nebulizer solution, Inhale into the lungs., Disp: , Rfl:    aspirin EC 81 MG tablet, Take 81 mg by mouth daily., Disp: , Rfl:    atorvastatin (LIPITOR) 10 MG tablet, Take 1 tablet by mouth daily., Disp: , Rfl:    baclofen (LIORESAL) 20 MG tablet, Take 20 mg by mouth 3 (three) times daily., Disp: , Rfl: 0   BACLOFEN IT, 414.12 mcg by Intrathecal route daily. Via intrathecal pain pump, Disp: , Rfl:    cloNIDine HCl, Analgesia, (CLONIDINE HCL, BULK,) 250 MG/50ML SOLN, 92,003 mcg by Does not apply route daily. Given via intrathecal pain pump, Disp: , Rfl:    dicyclomine (BENTYL) 10 MG capsule, TAKE 1  CAPSULE BY MOUTH EVERY 6 HOURS ASNEEDED, Disp: , Rfl:    fluconazole (DIFLUCAN) 100 MG tablet, Take 2 tabs x 14 days for UTI and have catheter changed by urology asap Take 1 tab daily while on antibacterial antibiotics, Disp: 28 tablet, Rfl: 5   gabapentin (NEURONTIN) 300 MG capsule, Take by mouth., Disp: , Rfl:    gabapentin (NEURONTIN) 600 MG tablet, Take 600 mg by mouth 2 (two) times daily., Disp: , Rfl:    guaiFENesin (MUCINEX) 600 MG 12 hr tablet, Take 1 tablet (600 mg total) by mouth 2 (two) times daily as needed., Disp: 180 tablet, Rfl: 1   losartan (COZAAR) 50 MG tablet, Take 1 tablet by mouth daily., Disp: , Rfl:    lubiprostone (AMITIZA) 24 MCG capsule, Take 1 capsule (24 mcg total) by mouth 2 (two) times daily with a meal. For severe neurogenic bowel/constipation and opioid induced constipaiton, Disp: 60 capsule, Rfl: 5   morphine 10 MG/5ML solution, Take by mouth every 2 (two) hours as needed for severe pain. Per patient, taking 1.804 mg/day via intrathecal pain pump, Disp: , Rfl:    naloxone (NARCAN) nasal spray 4 mg/0.1 mL, Place 1 spray into the nose once., Disp: , Rfl:    nitrofurantoin,  macrocrystal-monohydrate, (MACROBID) 100 MG capsule, Take 100 mg by mouth at bedtime., Disp: , Rfl:    omeprazole (PRILOSEC) 20 MG capsule, Take 20 mg by mouth daily., Disp: , Rfl:    ondansetron (ZOFRAN) 8 MG tablet, Take 8 mg by mouth every 8 (eight) hours as needed for nausea/vomiting., Disp: , Rfl:    Oxcarbazepine (TRILEPTAL) 300 MG tablet, Take 300 mg by mouth 3 (three) times daily., Disp: , Rfl:    oxybutynin (DITROPAN-XL) 10 MG 24 hr tablet, Take 20 mg by mouth., Disp: , Rfl:    oxycodone (ROXICODONE) 30 MG immediate release tablet, Take 30 mg by mouth 2 (two) times daily., Disp: , Rfl:    Probiotic Product (PROBIOTIC DAILY PO), Take by mouth., Disp: , Rfl:    silver sulfADIAZINE (SILVADENE) 1 % cream, Apply 1 Application topically 2 (two) times daily. For buttocks to heal wounds-, Disp: 400 g, Rfl: 5   tapentadol (NUCYNTA) 50 MG 12 hr tablet, Take by mouth every 12 (twelve) hours., Disp: , Rfl:    tiZANidine (ZANAFLEX) 4 MG tablet, Take 1 tablet (4 mg total) by mouth in the morning and at bedtime., Disp: 180 tablet, Rfl: 1   triamcinolone (KENALOG) 0.025 % cream, Use 2 x/day for inflammation, Disp: , Rfl:    venlafaxine (EFFEXOR) 75 MG tablet, Take 75 mg by mouth 2 (two) times daily., Disp: , Rfl:    Review of Systems  Constitutional:  Negative for chills and fever.  HENT:  Negative for congestion and sore throat.   Eyes:  Negative for photophobia.  Respiratory:  Negative for cough, shortness of breath and wheezing.   Cardiovascular:  Negative for chest pain, palpitations and leg swelling.  Gastrointestinal:  Negative for abdominal pain, blood in stool, constipation, diarrhea, nausea and vomiting.  Genitourinary:  Negative for dysuria, flank pain and hematuria.  Musculoskeletal:  Negative for back pain and myalgias.  Skin:  Negative for rash.  Neurological:  Negative for dizziness, weakness and headaches.  Hematological:  Does not bruise/bleed easily.  Psychiatric/Behavioral:   Negative for suicidal ideas.        Objective:   Physical Exam Constitutional:      Appearance: He is well-developed.  HENT:     Head: Normocephalic  and atraumatic.  Eyes:     Conjunctiva/sclera: Conjunctivae normal.  Cardiovascular:     Rate and Rhythm: Normal rate and regular rhythm.  Pulmonary:     Effort: Pulmonary effort is normal. No respiratory distress.     Breath sounds: No wheezing.  Abdominal:     General: There is no distension.     Palpations: Abdomen is soft.  Musculoskeletal:        General: No tenderness. Normal range of motion.     Cervical back: Normal range of motion and neck supple.  Skin:    General: Skin is warm and dry.     Coloration: Skin is not pale.     Findings: No erythema or rash.  Neurological:     General: No focal deficit present.     Mental Status: He is alert and oriented to person, place, and time.  Psychiatric:        Mood and Affect: Mood normal.        Behavior: Behavior normal.        Thought Content: Thought content normal.        Judgment: Judgment normal.           Assessment & Plan:  Recurrent UTI's and reported recurrent fungal infections of bladder--latter is NOT commmon  Continue with biweekly catheter exchanges and catheter exchanges in the context of infection.  I still wonder if he would benefit from a suprapubic catheter.  I am a bit skeptical that he is truly having candidal cystitis   To his recent hospitalization at [facility] we will endeavor to get records and microbiological data from them  He should have his catheter exchange anytime he has a yeast infection of the catheter.   I have personally spent  32 minutes involved in face-to-face and non-face-to-face activities for this patient on the day of the visit. Professional time spent includes the following activities: Preparing to see the patient (review of tests), Obtaining and/or reviewing separately obtained history (admission/discharge record),  Performing a medically appropriate examination and/or evaluation , Ordering medications/tests/procedures, referring and communicating with other health care professionals, Documenting clinical information in the EMR, Independently interpreting results (not separately reported), Communicating results to the patient/family/caregiver, Counseling and educating the patient/family/caregiver and Care coordination (not separately reported).

## 2022-06-14 ENCOUNTER — Other Ambulatory Visit: Payer: Self-pay

## 2022-06-14 ENCOUNTER — Ambulatory Visit (INDEPENDENT_AMBULATORY_CARE_PROVIDER_SITE_OTHER): Payer: Worker's Compensation | Admitting: Infectious Disease

## 2022-06-14 ENCOUNTER — Encounter: Payer: Self-pay | Admitting: Infectious Disease

## 2022-06-14 VITALS — BP 126/80 | HR 84 | Temp 98.4°F

## 2022-06-14 DIAGNOSIS — G894 Chronic pain syndrome: Secondary | ICD-10-CM | POA: Diagnosis not present

## 2022-06-14 DIAGNOSIS — N390 Urinary tract infection, site not specified: Secondary | ICD-10-CM

## 2022-06-14 DIAGNOSIS — G8220 Paraplegia, unspecified: Secondary | ICD-10-CM | POA: Diagnosis not present

## 2022-06-14 DIAGNOSIS — E782 Mixed hyperlipidemia: Secondary | ICD-10-CM

## 2022-06-14 DIAGNOSIS — B3741 Candidal cystitis and urethritis: Secondary | ICD-10-CM | POA: Diagnosis not present

## 2022-06-14 DIAGNOSIS — Z87440 Personal history of urinary (tract) infections: Secondary | ICD-10-CM

## 2022-06-14 DIAGNOSIS — F330 Major depressive disorder, recurrent, mild: Secondary | ICD-10-CM

## 2022-08-21 ENCOUNTER — Encounter: Payer: Self-pay | Admitting: Physical Medicine and Rehabilitation

## 2022-08-21 ENCOUNTER — Encounter
Payer: Worker's Compensation | Attending: Physical Medicine and Rehabilitation | Admitting: Physical Medicine and Rehabilitation

## 2022-08-21 VITALS — BP 106/65 | HR 83 | Ht 66.0 in | Wt 207.0 lb

## 2022-08-21 DIAGNOSIS — Z993 Dependence on wheelchair: Secondary | ICD-10-CM | POA: Insufficient documentation

## 2022-08-21 DIAGNOSIS — G8220 Paraplegia, unspecified: Secondary | ICD-10-CM | POA: Insufficient documentation

## 2022-08-21 DIAGNOSIS — G894 Chronic pain syndrome: Secondary | ICD-10-CM | POA: Diagnosis not present

## 2022-08-21 DIAGNOSIS — G950 Syringomyelia and syringobulbia: Secondary | ICD-10-CM | POA: Insufficient documentation

## 2022-08-21 DIAGNOSIS — R252 Cramp and spasm: Secondary | ICD-10-CM | POA: Diagnosis not present

## 2022-08-21 NOTE — Progress Notes (Signed)
Subjective:    Patient ID: Robert Mays, male    DOB: 08/26/1955, 67 y.o.   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 Also has HTN and borderline DM. Also has neurogenic bowel and bladder and spasticity with pain pain and ITB pump- managed for pain as well- Dr Roderic Ovens.    Here for f/u on SCI.   Been in hospital x2 since saw me- UTI June and Pneumonia and UTI last week- in for 3 days.  Let him leave a little early since had appt with Dr Roderic Ovens.   Sleep fairly good-  goes to sleep at ~ 2-3am- wakes him up 8am- and sleeps til 11-12 in AM.   Not on a phone much- but TV- rarely turned off-   Just turns volume down for sleep.  Keeps turning TV back on- and turns it on "accidentally".  In bed "so much"-  Wakes up at 3am, if goes ot sleep "early"-   If I stop talking to you, you fall asleep. In out appointments.   Repairs to w/c were done!  Hoyer repair was done- tightened it.  From 2021.  Hoyer lift is 100 lbs- has to manually get it where got to go.  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  Wants to get a lighter weight Hoyer lift.   Went to Management consultant- and leaving his worker-  Sloppy job- and off Tourist information centre manager- and misaligned-  Was done, but poorly done-  Hasn't done tile floor-  Asking for a change on wall so he can "change the temperature".   Declined the [employer] Niece- and would buy extended warranty- for 8 years and was going to responsible for taxes, tags and gas.  And would go back to them when he passed away.    Bowels OK with Amitiza- and prunes- can eat just a few a few days/week.  Doesn't give other oral bowel meds-  ABX helps him go as well.       Pain Inventory Average Pain 7 Pain Right Now 8 My pain is  intermittent, constant, burning, stabbing, and aching  LOCATION OF PAIN  bacl buttocks groin hip thigh knee leg ankle toes  BOWEL Number of stools per week: 8-10 Oral laxative use Yes  Type of laxative Amitiza Enema or suppository use No  History of colostomy No  Incontinent No   BLADDER Foley In and out cath, frequency n/a Able to self cath No  Bladder incontinence No  Frequent urination No  Leakage with coughing No  Difficulty starting stream No  Incomplete bladder emptying No    Mobility ability to climb steps?  no do you drive?  no use a wheelchair needs help with transfers Do you have any goals in this area?  no  Function disabled: date disabled 09/15/1996 I need assistance with the following:  dressing, bathing, toileting, meal prep, household duties, and shopping  Neuro/Psych weakness numbness tingling confusion  Prior Studies cxr  Physicians involved in your care Follow up   Family History  Problem Relation Age of Onset   Heart attack Father        Died in his 71's   Social History   Socioeconomic History   Marital status: Married  Spouse name: Not on file   Number of children: Not on file   Years of education: Not on file   Highest education level: Not on file  Occupational History   Not on file  Tobacco Use   Smoking status: Never    Passive exposure: Never   Smokeless tobacco: Current    Types: Chew  Vaping Use   Vaping status: Never Used  Substance and Sexual Activity   Alcohol use: No   Drug use: No   Sexual activity: Not Currently  Other Topics Concern   Not on file  Social History Narrative   Not on file   Social Determinants of Health   Financial Resource Strain: Patient Declined (09/18/2021)   Received from [facility], [facility]   Overall Financial Resource Strain (CARDIA)    Difficulty of Paying Living Expenses: Patient declined  Food Insecurity: Patient Declined (09/18/2021)   Received from [facility],  [facility]   Hunger Vital Sign    Worried About Running Out of Food in the Last Year: Patient declined    Ran Out of Food in the Last Year: Patient declined  Transportation Needs: Not on file  Physical Activity: Unknown (09/18/2021)   Received from [facility], [facility]   Exercise Vital Sign    Days of Exercise per Week: 0 days    Minutes of Exercise per Session: Not on file  Stress: Patient Declined (09/18/2021)   Received from [facility], [facility] - Occupational Stress Questionnaire    Feeling of Stress : Patient declined  Social Connections: Somewhat Isolated (09/18/2021)   Received from [facility], [facility]   Social Network    How would you rate your social network (family, work, friends)?: Restricted participation with some degree of social isolation   Past Surgical History:  Procedure Laterality Date   ABDOMINAL SURGERY     BACK SURGERY     CYSTOSCOPY W/ URETERAL STENT PLACEMENT Right 02/22/2018   Procedure: CYSTOSCOPY, RIGHT RETROGRADE PYELOGRAM WITH URETERAL STENT PLACEMENT;  Surgeon: Alfredo Martinez, MD;  Location: [facility];  Service: Urology;  Laterality: Right;   CYSTOSCOPY/URETEROSCOPY/HOLMIUM LASER/STENT PLACEMENT Right 03/18/2018   Procedure: CYSTOSCOPY/URETEROSCOPY/HOLMIUM LASER/STENT PLACEMENT;  Surgeon: Crista Elliot, MD;  Location: [facility];  Service: Urology;  Laterality: Right;   INTRATHECAL PUMP IMPLANTATION Left 2000  ?   baclofin,clonidine,morphine infusion   Past Medical History:  Diagnosis Date   Candida cystitis 02/08/2022   Cellulitis and abscess of left leg 07/2016   Chronic back pain    Depression    H/O spinal cord injury    Headache    Sepsis (HCC) 07/2016   BP 106/65   Pulse 83   Ht 5\' 6"  (1.676 m)   Wt 207 lb (93.9 kg) Comment: hosp wt in bed  SpO2 95%   BMI 33.41 kg/m   Opioid Risk Score:   Fall Risk Score:  `1  Depression screen [facility] 2/9      08/21/2022    9:53 AM 05/22/2022    9:10 AM 04/19/2022   10:37 AM 07/22/2021    9:42 AM 09/06/2020   10:08 AM  Depression screen PHQ 2/9  Decreased Interest 1 1 1 1 1   Down, Depressed, Hopeless 1 1 0 1 1  PHQ - 2 Score 2 2 1 2 2   Altered sleeping     1  Tired, decreased energy     3  Change in appetite  1  Feeling bad or failure about yourself      0  Trouble concentrating     2  Moving slowly or fidgety/restless     0  Suicidal thoughts     0  PHQ-9 Score     9      Review of Systems  Respiratory:  Positive for shortness of breath and wheezing.   Musculoskeletal:  Positive for back pain.       Leg ankle toe thigh groin hip buttock pain  All other systems reviewed and are negative.      Objective:   Physical Exam  Awake, alert, kept falling asleep;in power w/c; accompanied by wife, NAD  MAS of 1+ to 2 in LE's Clonus 3-4 beats RLE and 2-3 beats LLE.  Painful with ROM of LLE.  Has foley bag -medium amber urine.  Fingers peeling.         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 Also has HTN and borderline DM. Also has neurogenic bowel and bladder and spasticity with pain pain and ITB pump - managed for pain as well- Dr Roderic Ovens.    Here for f/u on SCI.   Needs to turn TV off at midnight at the latest. So can get into a better sleep cycle.   2. D/w wife about shower - don't think he needs to be able to change shower temperature himself- sounds like work still needs to be done to finish it.   3. Declined the w/c van.   4.  Appears Hoyer lift is 2021-  needs exact date when received it- so I THINK replacement is q5 years.  "Is heavy and bulky"- current one and asking for lighter weight hoyer due to her  issues pushing it.  Will do research into getting it replaced.  Wife also goes to Chiropractor for B/L knee arthritis.   5. 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    6. Con't Amitiza  7. Met with workers' IT sales professional-   8. Just got ITB pump refilled.   9. F/U in 3 months- double visits   10. Suggest silvadene- rub into fingers. For peeling.     I spent a total of  41  minutes on total care today- >50% coordination of care- due to d/w pt and wife about Michiel Sites lift, shower and declining the w/c van- and  hands and sleep patterns.

## 2022-08-21 NOTE — Patient Instructions (Addendum)
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 Also has HTN and borderline DM. Also has neurogenic bowel and bladder and spasticity with pain pain and ITB pump - managed for pain as well- Dr Roderic Ovens.    Here for f/u on SCI.   Needs to turn TV off at midnight at the latest. So can get into a better sleep cycle.   2. D/w wife about shower - don't think he needs to be able to change shower temperature himself- sounds like work still needs to be done to finish it.   3. Declined the w/c van.   4.  Appears Hoyer lift is 2021-  needs exact date when received it- so I THINK replacement is q5 years.  "Is heavy and bulky"- current one and asking for lighter weight hoyer due to her issues pushing it.  Will do research into getting it replaced.  Wife also goes to Chiropractor for B/L knee arthritis.   5. 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    6. Con't Amitiza- doing well.   7. Met with workers' IT sales professional-   8. Just got ITB pump refilled.   9. F/U in 3 months- double visits   10. Suggest silvadene- rub into fingers. For peeling.   11. Remote broken on bed- will get replaced.   12. Call me if needs refils.

## 2022-09-13 ENCOUNTER — Encounter: Payer: Self-pay | Admitting: Infectious Disease

## 2022-09-13 ENCOUNTER — Other Ambulatory Visit: Payer: Self-pay

## 2022-09-13 ENCOUNTER — Ambulatory Visit: Payer: Worker's Compensation | Admitting: Infectious Disease

## 2022-09-13 VITALS — BP 123/76 | HR 87 | Temp 97.6°F

## 2022-09-13 DIAGNOSIS — B9689 Other specified bacterial agents as the cause of diseases classified elsewhere: Secondary | ICD-10-CM

## 2022-09-13 DIAGNOSIS — G8220 Paraplegia, unspecified: Secondary | ICD-10-CM | POA: Diagnosis not present

## 2022-09-13 DIAGNOSIS — N390 Urinary tract infection, site not specified: Secondary | ICD-10-CM

## 2022-09-13 DIAGNOSIS — R600 Localized edema: Secondary | ICD-10-CM

## 2022-09-13 DIAGNOSIS — I10 Essential (primary) hypertension: Secondary | ICD-10-CM

## 2022-09-13 DIAGNOSIS — G950 Syringomyelia and syringobulbia: Secondary | ICD-10-CM

## 2022-09-13 DIAGNOSIS — B3741 Candidal cystitis and urethritis: Secondary | ICD-10-CM

## 2022-09-13 DIAGNOSIS — Z87440 Personal history of urinary (tract) infections: Secondary | ICD-10-CM

## 2022-09-13 HISTORY — DX: Urinary tract infection, site not specified: N39.0

## 2022-09-13 HISTORY — DX: Localized edema: R60.0

## 2022-09-13 MED ORDER — FLUCONAZOLE 100 MG PO TABS: ORAL_TABLET | ORAL | 5 refills | Status: DC

## 2022-09-13 NOTE — Progress Notes (Signed)
Subjective:   Chief complaint: For recurrent urinary tract infections at times with Candida complicating this.   Patient ID: Robert Mays, male    DOB: 17-Mar-1955, 67 y.o.   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  He has also suffered consequent neurogenic bladder with ncontinence, and spasciticity. He also has multiple kidney stones that hass undergone multiple interventions with Urology.   He has suffered recurrent urinary tract infections and according to the patient and his wife the majority of the recent ones have all been fungal urinary tract infections.   Prior history from notes:  "  One of the great difficulty of course for this patient is that he does not have much in the way of symptoms until his infections have progressed.  His wife says that once he starts becoming a little bit more fatigued and lethargic it progresses fairly rapidly.  We discussed that typically the treatment for fungal infection of the bladder is exchange of the catheter.  However it sounds like he is getting systemically ill prior to this being something that is possible.  It also sounds as if he was  getting fungal infections because he is given frequent antibiotics for recurrent pneumonias due to his paralysis and inability to fully ventilate his lungs.  Has his catheter to exchanged twice per month by urology currently.   I did NOT have access to his records from and off health and we will request release of information from [place].  Cultures with [facility] include cultures from December 19 which had 16,000 colony-forming units of yeast cultures from December 01, 2021 did not yield any organism cultures from November 15, 2021  yielded 90,000 colony-forming's of diphtheroids cultures from November 03, 2021 yielded 10,000 colony-forming units of yeast cultures from December 20, 2021 yielded 100,000 colony-forming units of yeast cultures from October 05, 2021 yielded no organisms   f he truly is having recurrent urinary tract infections during due to yeast which seems to be the story here I t thought the following things can be done to try to help his reduce hospitalizations is the following  #1 when he is prescribed an antibacterial antibiotic he is to take fluconazole 100mg  daily  #2 when he has the very beginnings of a UTI he is to start 200mg  of fluconazole and call Urology to have his catheter exchanged  Note I had given him prescriptions for fluconazole with 28 pills and I wanted to have fluconazole at home on hand at all times to cut down on the rate limiting steps on him getting antifungal therapy when he has a fungal urinary tract infection.  I would also suggest the possibility of him having a suprapubic catheter which he has wife said was never brought up to them before.  He was  working with Dr Berline Chough to reduce risk of aspiration pneumonia.  S        Is not clear to me that he had symptoms though this is difficult to assess in context of his paraplegia.  He was given antibacterial antibiotics followed by antifungals yet again.    I last saw him he had urinary tract infection diagnosed with [facility] and was prescribed Macrobid and then a second  antibiotic but failed to improve and deteriorated and ultimately was hospitalized at [facility] where he received IV antibiotics improvement in his symptoms.    Last visit he has had at least 2 hospitalizations at [facility].  He is also had cultures checked with [facility] even when he is not having symptoms.  They have also been treating these asymptomatic bacteriuria which I really do not understand as it has no impact on his  hospitalization rate.  His wife is not able to ever get him into a clinician fast enough when he starts to first develop signs of an infection and typically develops encephalopathy.  Has been giving him fluconazole after he completes antimicrobial antibiotics.   Had some lower extremity edema today and apparently had had some erythema on the left calf when he was in bed but this is now disappeared.        Past Medical History:  Diagnosis Date   Candida cystitis 02/08/2022   Cellulitis and abscess of left leg 07/2016   Chronic back pain    Depression    H/O spinal cord injury    Headache    Sepsis (HCC) 07/2016    Past Surgical History:  Procedure Laterality Date   ABDOMINAL SURGERY     BACK SURGERY     CYSTOSCOPY W/ URETERAL STENT PLACEMENT Right 02/22/2018   Procedure: CYSTOSCOPY, RIGHT RETROGRADE PYELOGRAM WITH URETERAL STENT PLACEMENT;  Surgeon: Alfredo Martinez, MD;  Location: [facility];  Service: Urology;  Laterality: Right;   CYSTOSCOPY/URETEROSCOPY/HOLMIUM LASER/STENT PLACEMENT Right 03/18/2018   Procedure: CYSTOSCOPY/URETEROSCOPY/HOLMIUM LASER/STENT PLACEMENT;  Surgeon: Crista Elliot, MD;  Location: [facility];  Service: Urology;  Laterality: Right;   INTRATHECAL PUMP IMPLANTATION Left 2000  ?   baclofin,clonidine,morphine infusion    Family History  Problem Relation Age of Onset   Heart attack Father        Died in his 82's      Social History   Socioeconomic History   Marital status: Married    Spouse name: Not on file   Number of children: Not on file   Years of education: Not on file   Highest education level: Not on file  Occupational History   Not on file  Tobacco Use   Smoking status: Never    Passive exposure: Never   Smokeless tobacco: Current    Types: Chew  Vaping Use   Vaping status: Never Used  Substance and Sexual Activity   Alcohol use: No   Drug use: No   Sexual activity: Not Currently  Other Topics Concern    Not on file  Social History Narrative   Not on file   Social Determinants of Health   Financial Resource Strain: Patient Declined (09/18/2021)   Received from [facility], [facility]   Overall Financial Resource Strain (CARDIA)    Difficulty of Paying Living Expenses: Patient declined  Food Insecurity: Low Risk  (08/31/2022)   Received from [facility]   Food vital sign    Within the past 12 months, you worried that your food would run out before you got money to buy more: Never true    Within the past 12 months, the food you bought just didn't last and you didn't have money to get more. : Never true  Transportation Needs: Not on file (08/31/2022)  Physical Activity: Unknown (09/18/2021)   Received from [facility], [facility]   Exercise Vital Sign    Days of Exercise per Week:  0 days    Minutes of Exercise per Session: Not on file  Stress: Patient Declined (09/18/2021)   Received from [facility], [facility] of Occupational Health - Occupational Stress Questionnaire    Feeling of Stress : Patient declined  Social Connections: Somewhat Isolated (09/18/2021)   Received from [facility], [facility]   Social Network    How would you rate your social network (family, work, friends)?: Restricted participation with some degree of social isolation    Allergies  Allergen Reactions   Sulfa Antibiotics Rash     Current Outpatient Medications:    albuterol (PROVENTIL) (2.5 MG/3ML) 0.083% nebulizer solution, Inhale into the lungs., Disp: , Rfl:    aspirin EC 81 MG tablet, Take 81 mg by mouth daily., Disp: , Rfl:    atorvastatin (LIPITOR) 10 MG tablet, Take 1 tablet by mouth daily., Disp: , Rfl:    baclofen (LIORESAL) 20 MG tablet, Take 20 mg by mouth 3 (three) times daily., Disp: , Rfl: 0   BACLOFEN IT, 414.12 mcg by Intrathecal route daily. Via intrathecal pain pump, Disp: , Rfl:    cloNIDine HCl, Analgesia, (CLONIDINE HCL, BULK,) 250 MG/50ML SOLN,  92,003 mcg by Does not apply route daily. Given via intrathecal pain pump, Disp: , Rfl:    dicyclomine (BENTYL) 10 MG capsule, TAKE 1 CAPSULE BY MOUTH EVERY 6 HOURS ASNEEDED, Disp: , Rfl:    fluconazole (DIFLUCAN) 100 MG tablet, Take 2 tabs x 14 days for UTI and have catheter changed by urology asap Take 1 tab daily while on antibacterial antibiotics, Disp: 28 tablet, Rfl: 5   gabapentin (NEURONTIN) 300 MG capsule, Take by mouth., Disp: , Rfl:    gabapentin (NEURONTIN) 600 MG tablet, Take 600 mg by mouth 2 (two) times daily., Disp: , Rfl:    guaiFENesin (MUCINEX) 600 MG 12 hr tablet, Take 1 tablet (600 mg total) by mouth 2 (two) times daily as needed., Disp: 180 tablet, Rfl: 1   losartan (COZAAR) 50 MG tablet, Take 1 tablet by mouth daily., Disp: , Rfl:    lubiprostone (AMITIZA) 24 MCG capsule, Take 1 capsule (24 mcg total) by mouth 2 (two) times daily with a meal. For severe neurogenic bowel/constipation and opioid induced constipaiton, Disp: 60 capsule, Rfl: 5   morphine 10 MG/5ML solution, Take by mouth every 2 (two) hours as needed for severe pain. Per patient, taking 1.804 mg/day via intrathecal pain pump, Disp: , Rfl:    naloxone (NARCAN) nasal spray 4 mg/0.1 mL, Place 1 spray into the nose once., Disp: , Rfl:    nitrofurantoin, macrocrystal-monohydrate, (MACROBID) 100 MG capsule, Take 100 mg by mouth at bedtime., Disp: , Rfl:    omeprazole (PRILOSEC) 20 MG capsule, Take 20 mg by mouth daily., Disp: , Rfl:    ondansetron (ZOFRAN) 8 MG tablet, Take 8 mg by mouth every 8 (eight) hours as needed for nausea/vomiting., Disp: , Rfl:    Oxcarbazepine (TRILEPTAL) 300 MG tablet, Take 300 mg by mouth 3 (three) times daily., Disp: , Rfl:    oxybutynin (DITROPAN-XL) 10 MG 24 hr tablet, Take 20 mg by mouth., Disp: , Rfl:    oxycodone (ROXICODONE) 30 MG immediate release tablet, Take 30 mg by mouth 2 (two) times daily., Disp: , Rfl:    Probiotic Product (PROBIOTIC DAILY PO), Take by mouth., Disp: , Rfl:     silver sulfADIAZINE (SILVADENE) 1 % cream, Apply 1 Application topically 2 (two) times daily. For buttocks to heal wounds-, Disp:  400 g, Rfl: 5   tapentadol (NUCYNTA) 50 MG 12 hr tablet, Take by mouth every 12 (twelve) hours., Disp: , Rfl:    tiZANidine (ZANAFLEX) 4 MG tablet, Take 1 tablet (4 mg total) by mouth in the morning and at bedtime., Disp: 180 tablet, Rfl: 1   triamcinolone (KENALOG) 0.025 % cream, Use 2 x/day for inflammation, Disp: , Rfl:    venlafaxine (EFFEXOR) 75 MG tablet, Take 75 mg by mouth 2 (two) times daily., Disp: , Rfl:    Review of Systems  Constitutional:  Negative for activity change, appetite change, chills, diaphoresis, fatigue, fever and unexpected weight change.  HENT:  Negative for congestion, rhinorrhea, sinus pressure, sneezing, sore throat and trouble swallowing.   Eyes:  Negative for photophobia and visual disturbance.  Respiratory:  Negative for cough, chest tightness, shortness of breath, wheezing and stridor.   Cardiovascular:  Positive for leg swelling. Negative for chest pain and palpitations.  Gastrointestinal:  Negative for abdominal distention, abdominal pain, anal bleeding, blood in stool, constipation, diarrhea, nausea and vomiting.  Genitourinary:  Negative for difficulty urinating, dysuria, flank pain and hematuria.  Musculoskeletal:  Negative for arthralgias, back pain, gait problem, joint swelling and myalgias.  Skin:  Negative for color change, pallor, rash and wound.  Neurological:  Negative for dizziness, tremors, weakness and light-headedness.  Hematological:  Negative for adenopathy. Does not bruise/bleed easily.  Psychiatric/Behavioral:  Negative for agitation, behavioral problems, confusion, decreased concentration, dysphoric mood and sleep disturbance.        Objective:   Physical Exam Constitutional:      Appearance: He is well-developed.  HENT:     Head: Normocephalic and atraumatic.  Eyes:     Conjunctiva/sclera: Conjunctivae  normal.  Cardiovascular:     Rate and Rhythm: Normal rate and regular rhythm.  Pulmonary:     Effort: Pulmonary effort is normal. No respiratory distress.     Breath sounds: No wheezing.  Abdominal:     General: There is no distension.     Palpations: Abdomen is soft.  Musculoskeletal:        General: No tenderness. Normal range of motion.     Cervical back: Normal range of motion and neck supple.  Skin:    General: Skin is warm and dry.     Coloration: Skin is not pale.     Findings: No erythema or rash.  Neurological:     Mental Status: He is alert and oriented to person, place, and time.  Psychiatric:        Mood and Affect: Mood normal.        Behavior: Behavior normal.        Thought Content: Thought content normal.        Judgment: Judgment normal.           Assessment & Plan:   Recurrent UTIs at times with fungal infections.  More recently has been bacterial urinary tract infections.  I really would advocate for not checking cultures on him when he is not symptomatic.  It is very frustrating that his urinary tract infections are not apparently capable being diagnosed early enough before he deteriorates and has to be hospitalized.  I do wonder if his suprapubic catheter might be of benefit to him.  His wife would like to continue to use fluconazole after bacterial infections to avoid candidal infections.   Lower extremity edema:  There is no evidence of cellulitis here which was of concern to the patient his wife and apparently  a caregiver who was there this morning

## 2022-11-22 ENCOUNTER — Telehealth: Payer: Self-pay

## 2022-11-22 ENCOUNTER — Encounter: Payer: Self-pay | Admitting: Physical Medicine and Rehabilitation

## 2022-11-22 ENCOUNTER — Encounter
Payer: Worker's Compensation | Attending: Physical Medicine and Rehabilitation | Admitting: Physical Medicine and Rehabilitation

## 2022-11-22 VITALS — BP 100/68 | HR 121 | Temp 98.1°F | Ht 66.0 in | Wt 207.0 lb

## 2022-11-22 DIAGNOSIS — G8220 Paraplegia, unspecified: Secondary | ICD-10-CM

## 2022-11-22 DIAGNOSIS — N390 Urinary tract infection, site not specified: Secondary | ICD-10-CM | POA: Diagnosis present

## 2022-11-22 DIAGNOSIS — N319 Neuromuscular dysfunction of bladder, unspecified: Secondary | ICD-10-CM

## 2022-11-22 DIAGNOSIS — Z993 Dependence on wheelchair: Secondary | ICD-10-CM | POA: Diagnosis present

## 2022-11-22 DIAGNOSIS — T83511D Infection and inflammatory reaction due to indwelling urethral catheter, subsequent encounter: Secondary | ICD-10-CM | POA: Diagnosis present

## 2022-11-22 DIAGNOSIS — R000 Tachycardia, unspecified: Secondary | ICD-10-CM | POA: Diagnosis present

## 2022-11-22 NOTE — Progress Notes (Signed)
Subjective:    Patient ID: Robert Mays, male    DOB: 18-Jan-1956, 67 y.o.   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 Also has HTN and borderline DM. Also has neurogenic bowel and bladder and spasticity with pain pain and ITB pump- managed for pain as well- Dr Roderic Ovens.    Here for f/u on SCI.   Had 11 day hospital stay for UTI- admitted [place] 9/17 to 9/20 and then [facility] from 9/20 to 9/27- was transferred by helicopter.   Started with UTI- then developed sepsis- had taken a UTI, Cipro-  but couldn't wake him the next AM- 61/47- and O2 level 73%- called ambulance.  Went to ICU- was put on ventilator for 7 days and unconscious 5 days.  Had pneumonia-also in hospital.    Had Hemorrhagic UTI.  Doing BP and O2 sats at night-  asking if anything to connect ot her phone.   Got the Zenaida Niece- got it September 26th.  Was concerned couldn't afford it- but found out can take off for taxes.   Hb is dropped to 10.4- likely due to sepsis- and that's likely why his HR is elevated to 122-  And lack of endurance.   Has new UTI-  was started on ABX yesterday- "took a week to get results"- usually gets U/A checked when they change his catheter.  and placed on Levaquin- PCP wanted to try ABX PO first- wife thinks he should be in hospital because was told can not be treated by oral ABX anymore.   BP 100/60-  Has appt to see ID 11/18-   Hasn't gotten new Hoyer lift.   Finger peeling are better per wife.    LBM almost 1 week ago- has had small movements, but nothing like "a real BM"- in last few days.  Thinks got UTI from a blow out- 2 weeks ago- when had blow out.   Got Zenaida Niece- working well for her- can do errands and out to  eat.  In passenger side, so likes that better.       Pain Inventory Average Pain 7 Pain Right Now 7 My pain is burning, dull, stabbing, and tingling  LOCATION OF PAIN  buttocks hip thigh leg knee ankle toes  BOWEL Number of stools per week: 6-10  BLADDER Foley  Mobility use a wheelchair needs help with transfers  Function disabled: date disabled . I need assistance with the following:  dressing, bathing, toileting, meal prep, household duties, and shopping  Neuro/Psych numbness tingling spasms confusion anxiety  Prior Studies Any changes since last visit?  yes recent hospitalization and being tx for UTI  Physicians involved in your care Any changes since last visit?  no   Family History  Problem Relation Age of Onset   Heart attack Father        Died in his 17's   Social History   Socioeconomic History   Marital status: Married    Spouse name: Not on file   Number of children: Not on file   Years of education: Not on file   Highest education level: Not on file  Occupational History   Not on file  Tobacco Use   Smoking status: Never  Passive exposure: Never   Smokeless tobacco: Current    Types: Chew  Vaping Use   Vaping status: Never Used  Substance and Sexual Activity   Alcohol use: No   Drug use: No   Sexual activity: Not Currently  Other Topics Concern   Not on file  Social History Narrative   Not on file   Social Determinants of Health   Financial Resource Strain: Patient Declined (09/18/2021)   Received from [facility], [facility]   Overall Financial Resource Strain (CARDIA)    Difficulty of Paying Living Expenses: Patient declined  Food Insecurity: Low Risk  (11/16/2022)   Received from [facility]   Hunger Vital Sign    Worried About Running Out of Food in the Last Year: Never true    Ran Out of Food in the Last Year: Never true  Transportation Needs: No Transportation Needs (11/16/2022)   Received from [employer]    In the past 12 months, has lack of reliable transportation kept you from medical appointments, meetings, work or from getting things needed for daily living? : No  Physical Activity: Unknown (09/18/2021)   Received from [facility], [facility]   Exercise Vital Sign    Days of Exercise per Week: 0 days    Minutes of Exercise per Session: Not on file  Stress: Patient Declined (09/18/2021)   Received from [facility], [facility] of Occupational Health - Occupational Stress Questionnaire    Feeling of Stress : Patient declined  Social Connections: Unknown (09/30/2022)   Received from [facility]   Social Network    Social Network: Not on file   Past Surgical History:  Procedure Laterality Date   ABDOMINAL SURGERY     BACK SURGERY     CYSTOSCOPY W/ URETERAL STENT PLACEMENT Right 02/22/2018   Procedure: CYSTOSCOPY, RIGHT RETROGRADE PYELOGRAM WITH URETERAL STENT PLACEMENT;  Surgeon: Alfredo Martinez, MD;  Location: [facility];  Service: Urology;  Laterality: Right;   CYSTOSCOPY/URETEROSCOPY/HOLMIUM LASER/STENT PLACEMENT Right 03/18/2018   Procedure: CYSTOSCOPY/URETEROSCOPY/HOLMIUM LASER/STENT PLACEMENT;  Surgeon: Crista Elliot, MD;  Location[facility];  Service: Urology;  Laterality: Right;   INTRATHECAL PUMP IMPLANTATION Left 2000  ?   baclofin,clonidine,morphine infusion   Past Medical History:  Diagnosis Date   Candida cystitis 02/08/2022   Cellulitis and abscess of left leg 07/2016   Chronic back pain    Depression    H/O spinal cord injury    Headache    Lower extremity edema 09/13/2022   Recurrent UTI 09/13/2022   Sepsis (HCC) 07/2016   BP 100/68   Pulse (!) 121   Temp 98.1 F (36.7 C)   Ht 5\' 6"  (1.676 m)   Wt 207 lb (93.9 kg) Comment: last recorded  SpO2 93%   BMI 33.41 kg/m   Opioid Risk Score:   Fall Risk Score:  `1  Depression screen PHQ 2/9     11/22/2022   10:00 AM 08/21/2022    9:53 AM  05/22/2022    9:10 AM 04/19/2022   10:37 AM 07/22/2021    9:42 AM 09/06/2020   10:08 AM  Depression screen PHQ 2/9  Decreased Interest 1 1 1 1 1 1   Down, Depressed, Hopeless 1 1 1  0 1 1  PHQ - 2 Score 2 2 2 1 2 2   Altered sleeping      1  Tired, decreased energy      3  Change  in appetite      1  Feeling bad or failure about yourself       0  Trouble concentrating      2  Moving slowly or fidgety/restless      0  Suicidal thoughts      0  PHQ-9 Score      9    Review of Systems  Constitutional: Negative.   HENT: Negative.    Eyes: Negative.   Respiratory: Negative.    Cardiovascular: Negative.   Gastrointestinal: Negative.   Endocrine: Negative.   Genitourinary: Negative.   Musculoskeletal:  Positive for back pain.       Spasms  Skin: Negative.   Allergic/Immunologic: Negative.   Neurological:  Positive for numbness.       Tingling  Hematological: Negative.   Psychiatric/Behavioral:  Positive for confusion. The patient is nervous/anxious.   All other systems reviewed and are negative.      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 Also has HTN and borderline DM. Also has neurogenic bowel and bladder and spasticity with pain pain and ITB pump- managed for pain as well- Dr  Roderic Ovens.    Here for f/u on SCI.    Asking if anything would alert her on phone-  for BP and O2 sats. Asked her to ask Internal medicine/PCP- because I'm not aware of anything.   2. If O2 sats drop below 90% - or 88%, call 911- cannot get O2 for home for O2 sats that are normally fine.   3. Last Michiel Sites lift was 3.5 years ago- February of 2021- so wouldn't replace at this time. But when time to, would replace with motorized light weight hoyer lift.    4. D/w Workers' IT sales professional-    5. Got Eulogio Bear went ok to go to doctor's appointments-  Going well for him. Got it "down pat".   6. Suggest calling PCP about heart rate of 120s- today- my goal is is doesn't get worse.    7. Use iron rich foods- not oral iron- since will make him more constipated. For anemia- d/w pt and wife.   8. ID or PCP has way to do IV ABX from home- but needs to get PICC line- so would need to go to hospital for PICC line-  But will need to discuss with ID- I don't handle these issues.    9.  F/U in 3 months double appt- SCI   I spent a total of 34   minutes on total care today- >50% coordination of care- due to d/w workers Radiographer, therapeutic; d/w wife about hoyer, UTI, tachycardia, and anemia as detailed above.

## 2022-11-22 NOTE — Patient Instructions (Signed)
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 Also has HTN and borderline DM. Also has neurogenic bowel and bladder and spasticity with pain pain and ITB pump- managed for pain as well- Dr Roderic Ovens.    Here for f/u on SCI.    Asking if anything would alert her on phone-  for BP and O2 sats. Asked her to ask Internal medicine/PCP- because I'm not aware of anything.   2. If O2 sats drop below 90% - or 88%, call 911- cannot get O2 for home for O2 sats that are normally fine.   3. Last Michiel Sites lift was 3.5 years ago- February of 2021- so wouldn't replace at this time. But when time to, would replace with motorized light weight hoyer lift.    4. D/w Workers' IT sales professional-    5. Got Eulogio Bear went ok to go to doctor's appointments-  Going well for him. Got it "down pat".   6. Suggest calling PCP about heart rate of 120s- today- my goal is is doesn't get worse.    7. Use iron rich foods- not oral iron- since will make him more constipated. For anemia- d/w pt and wife.   8. ID or PCP has way to do IV ABX from home- but needs to get PICC line- so would need to go to hospital for PICC line-  But will need to discuss with ID- I don't handle these issues.    9.  F/U in 3 months double appt- SCI

## 2022-11-22 NOTE — Telephone Encounter (Signed)
We received a fax from [facility] stating the patient has a UTI and needs UTI. Urology states patient's wife advised them that patient can only have IV abx for UTIs and oral abx do not work.  Per Vernona Rieger with [facility] Levaquin was sent in for the patient.    I follow up with the patient's wife and she states patient's pcp recommended that the patient get IV abx.  Patient's wife states the patient is currently taking the Levaquin and she will se how he does with it and if symptoms worsen she plans to take him to the ED. Patient schedule with our office on 12/11/22 offered and sooner appointment and she declined. Results below Quinne Pires T Pricilla Loveless

## 2022-12-11 ENCOUNTER — Encounter: Payer: Self-pay | Admitting: Infectious Disease

## 2022-12-11 ENCOUNTER — Other Ambulatory Visit: Payer: Self-pay

## 2022-12-11 ENCOUNTER — Ambulatory Visit: Payer: Worker's Compensation | Admitting: Infectious Disease

## 2022-12-11 VITALS — BP 104/68 | HR 113 | Temp 97.7°F

## 2022-12-11 DIAGNOSIS — N319 Neuromuscular dysfunction of bladder, unspecified: Secondary | ICD-10-CM | POA: Diagnosis not present

## 2022-12-11 DIAGNOSIS — Z87440 Personal history of urinary (tract) infections: Secondary | ICD-10-CM | POA: Diagnosis not present

## 2022-12-11 DIAGNOSIS — S343XXD Injury of cauda equina, subsequent encounter: Secondary | ICD-10-CM

## 2022-12-11 DIAGNOSIS — N390 Urinary tract infection, site not specified: Secondary | ICD-10-CM | POA: Diagnosis not present

## 2022-12-11 NOTE — Progress Notes (Signed)
Subjective:   Chief complaint:followup for recurrent UTI with he had another hospitalization at [facility]   Patient ID: Robert Mays, male    DOB: May 03, 1955, 67 y.o.   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  He has also suffered consequent neurogenic bladder with ncontinence, and spasciticity. He also has multiple kidney stones that hass undergone multiple interventions with Urology.   He has suffered recurrent urinary tract infections and according to the patient and his wife the majority of the recent ones have all been fungal urinary tract infections.   Prior history from notes:  "  One of the great difficulty of course for this patient is that he does not have much in the way of symptoms until his infections have progressed.  His wife says that once he starts becoming a little bit more fatigued and lethargic it progresses fairly rapidly.  We discussed that typically the treatment for fungal infection of the bladder is exchange of the catheter.  However it sounds like he is getting systemically ill prior to this being something that is possible.  It also sounds as if he was  getting fungal infections because he is given frequent antibiotics for recurrent pneumonias due to his paralysis and inability to fully ventilate his lungs.  Has his catheter to exchanged twice per month by urology currently.   I did NOT have access to his records from and off health and we will request release of information from [place].  Cultures with [facility] include cultures from December 19 which had 16,000 colony-forming units of yeast cultures from December 01, 2021 did not yield any organism cultures from November 15, 2021 yielded 90,000 colony-forming's of diphtheroids cultures from November 03, 2021 yielded 10,000 colony-forming units of yeast cultures from December 20, 2021 yielded 100,000 colony-forming units of yeast cultures from October 05, 2021 yielded no organisms   f he truly is having recurrent urinary tract infections during due to yeast which seems to be the story here I t thought the following things can be done to try to help his reduce hospitalizations is the following  #1 when he is prescribed an antibacterial antibiotic he is to take fluconazole 100mg  daily  #2 when he has the very beginnings of a UTI he is to start 200mg  of fluconazole and call Urology to have his catheter exchanged  Note I had given him prescriptions for fluconazole with 28 pills and I wanted to have fluconazole at home on hand at all times to cut down on the rate limiting steps on him getting antifungal therapy when he has a fungal urinary tract infection.  I would also suggest the possibility of him having a suprapubic catheter which he has wife said was never brought up to them before.  He was  working with Dr Berline Chough to reduce risk of aspiration pneumonia.  S        Is not clear to me that he had symptoms though this is difficult to assess in context of his paraplegia.  He was given antibacterial antibiotics followed by antifungals yet again.    I last saw him he had urinary tract infection diagnosed with [facility] and was prescribed Macrobid and then a second  antibiotic but failed to improve and deteriorated and ultimately was hospitalized at [facility] where he received IV antibiotics improvement in his symptoms.    Last visit he has had at least 2 hospitalizations at [facility].  He is also had cultures checked with [facility] even when he is not having symptoms.  They have also been treating these asymptomatic bacteriuria which I really do not understand as it has no impact on  his hospitalization rate.  His wife is not able to ever get him into a clinician fast enough when he starts to first develop signs of an infection and typically develops encephalopathy.  Has been giving him fluconazole after he completes antimicrobial antibiotics.   Since I last saw him he had urine cultured at [facility] but did not have antibiotics called in until the organism was identified and culture data seen and reviewed by the physician with ultimately levofloxacin having been called in though this appears to have been an ampicillin sensitive Enterococcus faecalis.  In any case he was hospitalized at [facility] yet again.  His wife is concerned that he is developing an or new urinary tract infection due to the change in the color and also becoming slightly more subtly tired.  He is due for Foley exchange [facility] tomorrow.  They have been getting cultures with every Foley catheter exchange seems.      Past Medical History:  Diagnosis Date   Candida cystitis 02/08/2022   Cellulitis and abscess of left leg 07/2016   Chronic back pain    Depression    H/O spinal cord injury    Headache    Lower extremity edema 09/13/2022   Recurrent UTI 09/13/2022   Sepsis (HCC) 07/2016    Past Surgical History:  Procedure Laterality Date   ABDOMINAL SURGERY     BACK SURGERY     CYSTOSCOPY W/ URETERAL STENT PLACEMENT Right 02/22/2018   Procedure: CYSTOSCOPY, RIGHT RETROGRADE PYELOGRAM WITH URETERAL STENT PLACEMENT;  Surgeon: Alfredo Martinez, MD;  Location: [facility];  Service: Urology;  Laterality: Right;   CYSTOSCOPY/URETEROSCOPY/HOLMIUM LASER/STENT PLACEMENT Right 03/18/2018   Procedure: CYSTOSCOPY/URETEROSCOPY/HOLMIUM LASER/STENT PLACEMENT;  Surgeon: Crista Elliot, MD;  Location: [facility];  Service: Urology;  Laterality: Right;   INTRATHECAL PUMP IMPLANTATION Left 2000  ?   baclofin,clonidine,morphine infusion    Family History  Problem  Relation Age of Onset   Heart attack Father        Died in his 62's      Social History   Socioeconomic History   Marital status: Married    Spouse name: Not on file   Number of children: Not on file   Years of education: Not on file   Highest education level: Not on file  Occupational History   Not on file  Tobacco Use   Smoking status: Never    Passive exposure: Never   Smokeless tobacco: Current    Types: Chew  Vaping Use   Vaping status: Never Used  Substance and Sexual Activity   Alcohol use: No   Drug use: No   Sexual activity: Not Currently  Other Topics Concern   Not on file  Social History Narrative   Not on file   Social Determinants of Health   Financial Resource Strain: Patient Declined (09/18/2021)   [facility], [facility]   Overall Financial Resource Strain (CARDIA)    Difficulty of Paying Living Expenses: Patient declined  Food Insecurity: Low Risk  (  11/16/2022)   Received from [facility]   Hunger Vital Sign    Worried About Running Out of Food in the Last Year: Never true    Ran Out of Food in the Last Year: Never true  Transportation Needs: No Transportation Needs (11/16/2022)   Received from [facility]    In the past 12 months, has lack of reliable transportation kept you from medical appointments, meetings, work or from getting things needed for daily living? : No  Physical Activity: Unknown (09/18/2021)   Received from [facility], [facility]   Exercise Vital Sign    Days of Exercise per Week: 0 days    Minutes of Exercise per Session: Not on file  Stress: Patient Declined (09/18/2021)   Received from [facility], [facility] - Occupational Stress Questionnaire    Feeling of Stress : Patient declined  Social Connections: Unknown (09/30/2022)   Received from [facility]   Social Network    Social Network: Not on file    Allergies  Allergen  Reactions   Sulfa Antibiotics Rash     Current Outpatient Medications:    albuterol (PROVENTIL) (2.5 MG/3ML) 0.083% nebulizer solution, Inhale into the lungs., Disp: , Rfl:    aspirin EC 81 MG tablet, Take 81 mg by mouth daily., Disp: , Rfl:    atorvastatin (LIPITOR) 10 MG tablet, Take 1 tablet by mouth daily., Disp: , Rfl:    baclofen (LIORESAL) 20 MG tablet, Take 20 mg by mouth 3 (three) times daily., Disp: , Rfl: 0   BACLOFEN IT, 414.12 mcg by Intrathecal route daily. Via intrathecal pain pump, Disp: , Rfl:    cloNIDine HCl, Analgesia, (CLONIDINE HCL, BULK,) 250 MG/50ML SOLN, 92,003 mcg by Does not apply route daily. Given via intrathecal pain pump, Disp: , Rfl:    dicyclomine (BENTYL) 10 MG capsule, TAKE 1 CAPSULE BY MOUTH EVERY 6 HOURS ASNEEDED, Disp: , Rfl:    fluconazole (DIFLUCAN) 100 MG tablet, Take 2 tabs x 14 days for UTI and have catheter changed by urology asap Take 1 tab daily while on antibacterial antibiotics, Disp: 28 tablet, Rfl: 5   gabapentin (NEURONTIN) 300 MG capsule, Take by mouth., Disp: , Rfl:    gabapentin (NEURONTIN) 600 MG tablet, Take 600 mg by mouth 2 (two) times daily., Disp: , Rfl:    guaiFENesin (MUCINEX) 600 MG 12 hr tablet, Take 1 tablet (600 mg total) by mouth 2 (two) times daily as needed., Disp: 180 tablet, Rfl: 1   levofloxacin (LEVAQUIN) 750 MG tablet, Take 750 mg by mouth daily., Disp: , Rfl:    losartan (COZAAR) 50 MG tablet, Take 1 tablet by mouth daily., Disp: , Rfl:    lubiprostone (AMITIZA) 24 MCG capsule, Take 1 capsule (24 mcg total) by mouth 2 (two) times daily with a meal. For severe neurogenic bowel/constipation and opioid induced constipaiton, Disp: 60 capsule, Rfl: 5   morphine 10 MG/5ML solution, Take by mouth every 2 (two) hours as needed for severe pain. Per patient, taking 1.804 mg/day via intrathecal pain pump, Disp: , Rfl:    naloxone (NARCAN) nasal spray 4 mg/0.1 mL, Place 1 spray into the nose once., Disp: , Rfl:    nitrofurantoin,  macrocrystal-monohydrate, (MACROBID) 100 MG capsule, Take 100 mg by mouth at bedtime., Disp: , Rfl:    omeprazole (PRILOSEC) 20 MG capsule, Take 20 mg by mouth daily., Disp: , Rfl:    ondansetron (ZOFRAN) 8 MG tablet,  Take 8 mg by mouth every 8 (eight) hours as needed for nausea/vomiting., Disp: , Rfl:    Oxcarbazepine (TRILEPTAL) 300 MG tablet, Take 300 mg by mouth 3 (three) times daily., Disp: , Rfl:    oxybutynin (DITROPAN-XL) 10 MG 24 hr tablet, Take 20 mg by mouth., Disp: , Rfl:    oxycodone (ROXICODONE) 30 MG immediate release tablet, Take 30 mg by mouth 2 (two) times daily., Disp: , Rfl:    Probiotic Product (PROBIOTIC DAILY PO), Take by mouth., Disp: , Rfl:    silver sulfADIAZINE (SILVADENE) 1 % cream, Apply 1 Application topically 2 (two) times daily. For buttocks to heal wounds-, Disp: 400 g, Rfl: 5   tapentadol (NUCYNTA) 50 MG 12 hr tablet, Take by mouth every 12 (twelve) hours., Disp: , Rfl:    tiZANidine (ZANAFLEX) 4 MG tablet, Take 1 tablet (4 mg total) by mouth in the morning and at bedtime., Disp: 180 tablet, Rfl: 1   triamcinolone (KENALOG) 0.025 % cream, Use 2 x/day for inflammation, Disp: , Rfl:    venlafaxine (EFFEXOR) 75 MG tablet, Take 75 mg by mouth 2 (two) times daily., Disp: , Rfl:    Review of Systems  Unable to perform ROS: Dementia       Objective:   Physical Exam Constitutional:      Appearance: He is well-developed.  HENT:     Head: Normocephalic and atraumatic.  Eyes:     Conjunctiva/sclera: Conjunctivae normal.  Cardiovascular:     Rate and Rhythm: Normal rate and regular rhythm.  Pulmonary:     Effort: Pulmonary effort is normal. No respiratory distress.     Breath sounds: No wheezing.  Abdominal:     General: There is no distension.     Palpations: Abdomen is soft.  Musculoskeletal:        General: No tenderness. Normal range of motion.     Cervical back: Normal range of motion and neck supple.  Skin:    General: Skin is warm and dry.      Coloration: Skin is not pale.     Findings: No erythema or rash.  Neurological:     Mental Status: He is alert and oriented to person, place, and time.  Psychiatric:        Mood and Affect: Mood normal.        Behavior: Behavior normal.        Thought Content: Thought content normal.        Judgment: Judgment normal.           Assessment & Plan:   Recurrent UTIs :   We will try to expedite diagnosis and therapeutic inftervention by asking wife and patient to bring specimen container to [facility] so that when they obtain the urine for UA and culture it can be processed by our lab and I will see the cultures FIRST and can react to them if his symptoms are concerning for UTI--it is very difficult to discern in him with his lack of sensation in this area    Recurrent candidal infections: these seem to have been held off by giving antifungals after antibacterials  I have personally spent 42 minutes involved in face-to-face and non-face-to-face activities for this patient on the day of the visit. Professional time spent includes the following activities: Preparing to see the patient (review of tests), Obtaining and/or reviewing separately obtained history (admission/discharge record), Performing a medically appropriate examination and/or evaluation , Ordering medications/tests/procedures, referring and communicating with other health  care professionals, Documenting clinical information in the EMR, Independently interpreting results (not separately reported), Communicating results to the patient/family/caregiver, Counseling and educating the patient/family/caregiver and Care coordination (not separately reported).

## 2022-12-12 ENCOUNTER — Other Ambulatory Visit: Payer: Self-pay

## 2022-12-12 ENCOUNTER — Other Ambulatory Visit: Payer: Medicare Other

## 2022-12-12 DIAGNOSIS — N390 Urinary tract infection, site not specified: Secondary | ICD-10-CM

## 2022-12-13 ENCOUNTER — Telehealth: Payer: Self-pay

## 2022-12-13 NOTE — Telephone Encounter (Signed)
Received fax from [facility] requesting treatment advice for urine culture, culture is from July. Spoke with Morrie Sheldon at [facility], requested more recent urine culture. She will fax result from 10/2022.  Sandie Ano, RN

## 2022-12-14 ENCOUNTER — Telehealth: Payer: Self-pay

## 2022-12-14 LAB — URINE CULTURE
MICRO NUMBER:: 15751804
Result:: NO GROWTH
SPECIMEN QUALITY:: ADEQUATE

## 2022-12-14 NOTE — Telephone Encounter (Signed)
I spoke to patient's wife and advised her of lab results. Patient's wife verbalized understanding. No questions. Markitta Ausburn Jonathon Resides, CMA

## 2022-12-14 NOTE — Telephone Encounter (Signed)
-----   Message from [place] sent at 12/14/2022  8:34 AM EST ----- Regarding: urine culture here grew NOTHING so no indication for antibiotics  ----- Message ----- From: Interface, [facility] Results In Sent: 12/14/2022   1:40 AM EST To: Randall Hiss, MD

## 2022-12-25 ENCOUNTER — Telehealth: Payer: Self-pay

## 2022-12-25 ENCOUNTER — Other Ambulatory Visit: Payer: Self-pay

## 2022-12-25 MED ORDER — AMOXICILLIN 500 MG PO CAPS: 500.0000 mg | ORAL_CAPSULE | Freq: Three times a day (TID) | ORAL | 0 refills | Status: DC

## 2022-12-25 NOTE — Telephone Encounter (Signed)
-----   Message from [place] sent at 12/25/2022 10:09 AM EST ----- See having much in the way of terms certainly if he needs an antibiotic on hand amoxicillin should work fine 500 mg orally 3 times daily for 5 days.  This organism was AMP sensitive so plain on amoxicillin and NOT an IV antibiotic be whwat we would use. Can we reach out to the wife to get more information on how he is doing and if they need me to call in some amoxicillin for him ----- Message ----- From: Fuller Song Sent: 12/25/2022   9:47 AM EST To: Randall Hiss, MD

## 2022-12-25 NOTE — Telephone Encounter (Signed)
Spoke to patient wife - she stated patient had some confusion yesterday and urine was darker this morning.   Per Dr.Van Dam send in rx for Amoxicllin 500 mg 1 tablet TID x 5 days. RX sent to [facility] pharmacy.   Patient wife aware.   Jemeka Wagler Lesli Albee, CMA

## 2022-12-28 ENCOUNTER — Other Ambulatory Visit: Payer: Self-pay | Admitting: Physical Medicine and Rehabilitation

## 2022-12-29 ENCOUNTER — Telehealth: Payer: Self-pay

## 2022-12-29 NOTE — Telephone Encounter (Signed)
Cultures faxed office. Left in providers box

## 2022-12-29 NOTE — Telephone Encounter (Signed)
Received call from patient spouse to confirm if MD received results from [facility]. States that results we supposed to be faxed to office at 330 yesterday. Informed patient and spouse that I do not see any results. Recommended they follow up with [facility] for results and have them faxed to office.  Would like to know if provider is able to review them as soon as possible to confirm antibiotics for patient to prevent admission.   Juanita Laster, RMA

## 2023-01-01 NOTE — Telephone Encounter (Signed)
Spoke with patient's spouse regarding results. States that urology started him on Cipro Thursday and has noticed some improvement regarding symptoms he was having.  Was only prescribed seven days 12/28/22.  Due to patient history with UTI spouse would like to know if he needs to be on antibiotics longer. Is worried that if patient stops antibiotics too early his symptoms will return.  Juanita Laster, RMA

## 2023-01-01 NOTE — Telephone Encounter (Signed)
Spoke to patient wife regarding provider's message. Is still worried that patient will get worse after taking last dose. Tried to explain reason for not extending antibiotics, but she states she will just figure it out before ending call. Juanita Laster, RMA

## 2023-02-05 ENCOUNTER — Ambulatory Visit: Payer: Medicare Other | Admitting: Infectious Disease

## 2023-02-06 NOTE — Progress Notes (Signed)
 Subjective:   Chief complaint: follow-up for recurrent UTI's, bateruria   Patient ID: Robert Mays, male    DOB: Oct 27, 1955, 68 y.o.   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  He has also suffered consequent neurogenic bladder with ncontinence, and spasciticity. He also has multiple kidney stones that hass undergone multiple interventions with Urology.   He has suffered recurrent urinary tract infections and according to the patient and his wife the majority of the recent ones have all been fungal urinary tract infections.   Prior history from notes:  "  One of the great difficulty of course for this patient is that he does not have much in the way of symptoms until his infections have progressed.  His wife says that once he starts becoming a little bit more fatigued and lethargic it progresses fairly rapidly.  We discussed that typically the treatment for fungal infection of the bladder is exchange of the catheter.  However it sounds like he is getting systemically ill prior to this being something that is possible.  It also sounds as if he was  getting fungal infections because he is given frequent antibiotics for recurrent pneumonias due to his paralysis and inability to fully ventilate his lungs.  Has his catheter to exchanged twice per month by urology currently.   I did NOT have access to his records from and off health and we will request release of information from [place].  Cultures with [facility] include cultures from December 19 which had 16,000 colony-forming units of yeast cultures from December 01, 2021 did not yield any organism cultures from November 15, 2021 yielded 90,000 colony-forming's of  diphtheroids cultures from November 03, 2021 yielded 10,000 colony-forming units of yeast cultures from December 20, 2021 yielded 100,000 colony-forming units of yeast cultures from October 05, 2021 yielded no organisms   f he truly is having recurrent urinary tract infections during due to yeast which seems to be the story here I t thought the following things can be done to try to help his reduce hospitalizations is the following  #1 when he is prescribed an antibacterial antibiotic he is to take fluconazole  100mg  daily  #2 when he has the very beginnings of a UTI he is to start 200mg  of fluconazole  and call Urology to have his catheter exchanged  Note I had given him prescriptions for fluconazole  with 28 pills and I wanted to have fluconazole  at home on hand at all times to cut down on the rate limiting steps on him getting antifungal therapy when he has a fungal urinary tract infection.  I would also suggest the possibility of him having a suprapubic catheter which he has wife said was never brought up to them before.  He was  working with Dr Raynaldo Call to reduce risk of aspiration pneumonia.  S        Is not clear to me that he had symptoms though this is difficult to assess in context of his paraplegia.  He was given antibacterial antibiotics followed by antifungals yet again.    I last saw him he had urinary tract infection diagnosed with [facility] and was prescribed Macrobid and then a second antibiotic but failed to improve and  deteriorated and ultimately was hospitalized at [facility] where he received IV antibiotics improvement in his symptoms.    Last visit he has had at least 2 hospitalizations at [facility].  He is also had cultures checked with [facility] even when he is not having symptoms.  They have also been treating these asymptomatic bacteriuria which I really do not understand as it has no impact on his hospitalization rate.  His wife is  not able to ever get him into a clinician fast enough when he starts to first develop signs of an infection and typically develops encephalopathy.  Has been giving him fluconazole  after he completes antimicrobial antibiotics.   Since I last saw him he had urine cultured at [facility] but did not have antibiotics called in until the organism was identified and culture data seen and reviewed by the physician with ultimately levofloxacin having been called in though this appears to have been an ampicillin sensitive Enterococcus faecalis.  In any case he was hospitalized at [facility] yet again.  His wife was  concerned that he is developing an or new urinary tract infection due to the change in the color and also becoming slightly more subtly tired."   Since I last saw the patient he had cultures done at [facility] urology in December.  He appears to have only grown about 40,000 colony-forming units of a bacteria that did not look consistent with being a pathogen based on colony count alone.  He has been antibiotic course for 7 days and was treated with ciprofloxacin  which was active against the organism.  I did not recommend more antibiotics beyond 7 days and would not of recommended them.  Based on the paucity of bacteria in the urine.  Since I last saw him he had a urine culture with catheter exchange performed in early January  See below      Bacteria was only sensitive to IV antibiotics as well as doxycycline and Bactrim .  He is allergic to Bactrim  and was prescribed doxycycline.  Scearce me the doxycycline is even listed as an agent since it does not penetrate well into the bladder in any case the patient is better after having been treated with doxycycline and has had no problems in the ensuing weeks.     Past Medical History:  Diagnosis Date   Candida cystitis 02/08/2022   Cellulitis and abscess of left leg 07/2016   Chronic back pain    Depression    H/O spinal  cord injury    Headache    Lower extremity edema 09/13/2022   Recurrent UTI 09/13/2022   Sepsis (HCC) 07/2016    Past Surgical History:  Procedure Laterality Date   ABDOMINAL SURGERY     BACK SURGERY     CYSTOSCOPY W/ URETERAL STENT PLACEMENT Right 02/22/2018   Procedure: CYSTOSCOPY, RIGHT RETROGRADE PYELOGRAM WITH URETERAL STENT PLACEMENT;  Surgeon: Erman Hayward, MD;  Location: [facility];  Service: Urology;  Laterality: Right;   CYSTOSCOPY/URETEROSCOPY/HOLMIUM LASER/STENT PLACEMENT Right 03/18/2018   Procedure: CYSTOSCOPY/URETEROSCOPY/HOLMIUM LASER/STENT PLACEMENT;  Surgeon: Samson Croak, MD;  Location: [facility];  Service: Urology;  Laterality: Right;   INTRATHECAL PUMP IMPLANTATION Left 2000  ?   baclofin,clonidine,morphine infusion    Family History  Problem Relation Age of Onset   Heart attack Father        Died in his 58's      Social History   Socioeconomic History   Marital status: Married  Spouse name: Not on file   Number of children: Not on file   Years of education: Not on file   Highest education level: Not on file  Occupational History   Not on file  Tobacco Use   Smoking status: Never    Passive exposure: Never   Smokeless tobacco: Current    Types: Chew  Vaping Use   Vaping status: Never Used  Substance and Sexual Activity   Alcohol use: No   Drug use: No   Sexual activity: Not Currently  Other Topics Concern   Not on file  Social History Narrative   Not on file   Social Drivers of Health   Financial Resource Strain: Patient Declined (09/18/2021)   Received [facility], [facility]   Overall Financial Resource Strain (CARDIA)    Difficulty of Paying Living Expenses: Patient declined  Food Insecurity: Low Risk  (11/16/2022)   Received from [facility]   Hunger Vital Sign    Worried About Running Out of Food in the Last Year: Never true    Ran Out of Food in the Last Year: Never true  Transportation Needs:  No Transportation Needs (11/16/2022)   Received from [facility]    In the past 12 months, has lack of reliable transportation kept you from medical appointments, meetings, work or from getting things needed for daily living? : No  Physical Activity: Unknown (09/18/2021)   Received from [facility], [facility]   Exercise Vital Sign    Days of Exercise per Week: 0 days    Minutes of Exercise per Session: Not on file  Stress: Patient Declined (09/18/2021)   Received from [facility], [facility] - Occupational Stress Questionnaire    Feeling of Stress : Patient declined  Social Connections: Unknown (09/30/2022)   Received from [facility]   Social Network    Social Network: Not on file    Allergies  Allergen Reactions   Sulfa  Antibiotics Rash     Current Outpatient Medications:    albuterol  (PROVENTIL ) (2.5 MG/3ML) 0.083% nebulizer solution, Inhale into the lungs., Disp: , Rfl:    amoxicillin  (AMOXIL ) 500 MG capsule, Take 1 capsule (500 mg total) by mouth 3 (three) times daily., Disp: 15 capsule, Rfl: 0   aspirin  EC 81 MG tablet, Take 81 mg by mouth daily., Disp: , Rfl:    atorvastatin (LIPITOR) 10 MG tablet, Take 1 tablet by mouth daily., Disp: , Rfl:    baclofen  (LIORESAL ) 20 MG tablet, Take 20 mg by mouth 3 (three) times daily., Disp: , Rfl: 0   BACLOFEN  IT, 414.12 mcg by Intrathecal route daily. Via intrathecal pain pump, Disp: , Rfl:    cloNIDine HCl, Analgesia, (CLONIDINE HCL, BULK,) 250 MG/50ML SOLN, 92,003 mcg by Does not apply route daily. Given via intrathecal pain pump, Disp: , Rfl:    cyanocobalamin (VITAMIN B12) 1000 MCG tablet, Take 1,000 mcg by mouth daily., Disp: , Rfl:    dicyclomine (BENTYL) 10 MG capsule, TAKE 1 CAPSULE BY MOUTH EVERY 6 HOURS ASNEEDED, Disp: , Rfl:    fluconazole  (DIFLUCAN ) 100 MG tablet, Take 2 tabs x 14 days for UTI and have catheter changed by urology asap Take 1 tab daily  while on antibacterial antibiotics, Disp: 28 tablet, Rfl: 5   gabapentin  (NEURONTIN ) 300 MG capsule, Take by mouth., Disp: , Rfl:    gabapentin  (NEURONTIN ) 600 MG tablet, Take 600 mg by mouth 2 (two) times  daily. (Patient not taking: Reported on 12/11/2022), Disp: , Rfl:    guaiFENesin  (GOODSENSE MUCUS ER) 600 MG 12 hr tablet, TAKE 1 TABLET BY MOUTH TWICE DAILY AS NEEDED., Disp: 180 tablet, Rfl: 3   levofloxacin (LEVAQUIN) 750 MG tablet, Take 750 mg by mouth daily. (Patient not taking: Reported on 12/11/2022), Disp: , Rfl:    losartan (COZAAR) 50 MG tablet, Take 1 tablet by mouth daily. (Patient not taking: Reported on 12/11/2022), Disp: , Rfl:    lubiprostone  (AMITIZA ) 24 MCG capsule, Take 1 capsule (24 mcg total) by mouth 2 (two) times daily with a meal. For severe neurogenic bowel/constipation and opioid induced constipaiton, Disp: 60 capsule, Rfl: 5   morphine 10 MG/5ML solution, Take by mouth every 2 (two) hours as needed for severe pain. Per patient, taking 1.804 mg/day via intrathecal pain pump, Disp: , Rfl:    naloxone (NARCAN) nasal spray 4 mg/0.1 mL, Place 1 spray into the nose once., Disp: , Rfl:    nitrofurantoin, macrocrystal-monohydrate, (MACROBID) 100 MG capsule, Take 100 mg by mouth at bedtime. (Patient not taking: Reported on 12/11/2022), Disp: , Rfl:    omeprazole (PRILOSEC) 20 MG capsule, Take 20 mg by mouth daily., Disp: , Rfl:    ondansetron  (ZOFRAN ) 8 MG tablet, Take 8 mg by mouth every 8 (eight) hours as needed for nausea/vomiting., Disp: , Rfl:    Oxcarbazepine  (TRILEPTAL ) 300 MG tablet, Take 300 mg by mouth 3 (three) times daily., Disp: , Rfl:    oxybutynin  (DITROPAN -XL) 10 MG 24 hr tablet, Take 20 mg by mouth., Disp: , Rfl:    oxycodone  (ROXICODONE ) 30 MG immediate release tablet, Take 30 mg by mouth 2 (two) times daily., Disp: , Rfl:    Probiotic Product (PROBIOTIC DAILY PO), Take by mouth. (Patient not taking: Reported on 12/11/2022), Disp: , Rfl:    silver  sulfADIAZINE   (SILVADENE ) 1 % cream, Apply 1 Application topically 2 (two) times daily. For buttocks to heal wounds-, Disp: 400 g, Rfl: 5   tapentadol (NUCYNTA) 50 MG 12 hr tablet, Take by mouth every 12 (twelve) hours., Disp: , Rfl:    tiZANidine  (ZANAFLEX ) 4 MG tablet, Take 1 tablet (4 mg total) by mouth in the morning and at bedtime., Disp: 180 tablet, Rfl: 1   triamcinolone (KENALOG) 0.025 % cream, Use 2 x/day for inflammation, Disp: , Rfl:    venlafaxine  (EFFEXOR ) 75 MG tablet, Take 75 mg by mouth 2 (two) times daily., Disp: , Rfl:    Review of Systems  Unable to perform ROS: Dementia       Objective:   Physical Exam        Assessment & Plan:  Recurrent UTII's:  Since he does not have any urinary symptoms due to his spinal cord injury it is difficult to ascertain when he is developing a urinary tract infection his wife noticed more cloudiness and sediment in the urine but this of course is not in itself a specific finding for urinary tract infection.  He then typically will become confused but again these symptoms of confusion are not specific to UTI.  Makes diagnosing these infections promptly problematic.  1 looks the last 2 urine cultures he had as well 1 had low colony forming counts which would not be consistent with an actual urinary tract infection and the other 1 had an organism that was treated with doxycycline which is an agent that does not penetrate the bladder.  I would argue that both of these instances may not abaxial been  urinary tract infections but potentially just episodes of confusion and delirium superimposed on his chronic dementia.  That being said I would not dispute that he does have actual urinary tract infections from time to time.  Will try to make the process of him getting cultures with [facility] and if treatment is needed coordination with them and ourselves more smooth I have asked that they always call our office whenever Dr. McDermott's went to  prescribe an antibiotic to make sure that we agree with the antibiotic and that the cultures are sent to us  and that we know they are coming so I can review them in a timely manner Recurrent UTIs :  Recurrent candidal infections:  He has been getting antifungal therapy after finishing antibacterial therapy this is probably "overkill" but the patient and wife feel more comfortable with this approach   I have personally spent 30 minutes involved in face-to-face and non-face-to-face activities for this patient on the day of the visit. Professional time spent includes the following activities: Preparing to see the patient (review of tests), Obtaining and/or reviewing separately obtained history (admission/discharge record), Performing a medically appropriate examination and/or evaluation , Ordering medications/tests/procedures, referring and communicating with other health care professionals, Documenting clinical information in the EMR, Independently interpreting results (not separately reported), Communicating results to the patient/family/caregiver, Counseling and educating the patient/family/caregiver and Care coordination (not separately reported).

## 2023-02-07 ENCOUNTER — Ambulatory Visit: Payer: Worker's Compensation | Admitting: Infectious Disease

## 2023-02-07 ENCOUNTER — Other Ambulatory Visit: Payer: Self-pay

## 2023-02-07 VITALS — BP 101/68 | HR 97 | Temp 96.5°F

## 2023-02-07 DIAGNOSIS — N319 Neuromuscular dysfunction of bladder, unspecified: Secondary | ICD-10-CM

## 2023-02-07 DIAGNOSIS — B3741 Candidal cystitis and urethritis: Secondary | ICD-10-CM

## 2023-02-07 DIAGNOSIS — F03B0 Unspecified dementia, moderate, without behavioral disturbance, psychotic disturbance, mood disturbance, and anxiety: Secondary | ICD-10-CM

## 2023-02-07 DIAGNOSIS — N390 Urinary tract infection, site not specified: Secondary | ICD-10-CM | POA: Diagnosis not present

## 2023-02-07 DIAGNOSIS — T85615A Breakdown (mechanical) of other nervous system device, implant or graft, initial encounter: Secondary | ICD-10-CM

## 2023-02-07 DIAGNOSIS — Z993 Dependence on wheelchair: Secondary | ICD-10-CM

## 2023-02-07 DIAGNOSIS — G950 Syringomyelia and syringobulbia: Secondary | ICD-10-CM | POA: Diagnosis not present

## 2023-02-22 ENCOUNTER — Telehealth: Payer: Self-pay

## 2023-02-22 ENCOUNTER — Other Ambulatory Visit: Payer: Self-pay | Admitting: Infectious Disease

## 2023-02-22 MED ORDER — FOSFOMYCIN TROMETHAMINE 3 G PO PACK: PACK | ORAL | 2 refills | Status: DC

## 2023-02-22 NOTE — Telephone Encounter (Signed)
Patient wife called stating that [facility] recently done a culture on patient - culture grew citrobacter Freundi. Patient urologist stated he would leave up to Dr.Van Dam to prescribe antibiotic - patient uses [facility] in [place].    Shy Guallpa Lesli Albee, CMA

## 2023-02-22 NOTE — Telephone Encounter (Signed)
Spoke to patient wife she stated that he has the typical symptoms that he always has a low grade fever (didn't know exact temp), some confusion yesterday. Will send culture results to you now.

## 2023-02-27 ENCOUNTER — Other Ambulatory Visit: Payer: Self-pay | Admitting: Physical Medicine and Rehabilitation

## 2023-02-28 ENCOUNTER — Encounter: Payer: Self-pay | Admitting: Physical Medicine and Rehabilitation

## 2023-02-28 ENCOUNTER — Encounter
Payer: Worker's Compensation | Attending: Physical Medicine and Rehabilitation | Admitting: Physical Medicine and Rehabilitation

## 2023-02-28 VITALS — BP 98/66 | HR 90 | Ht 66.0 in | Wt 215.0 lb

## 2023-02-28 DIAGNOSIS — G894 Chronic pain syndrome: Secondary | ICD-10-CM

## 2023-02-28 DIAGNOSIS — Z993 Dependence on wheelchair: Secondary | ICD-10-CM | POA: Diagnosis present

## 2023-02-28 DIAGNOSIS — N390 Urinary tract infection, site not specified: Secondary | ICD-10-CM

## 2023-02-28 DIAGNOSIS — G8220 Paraplegia, unspecified: Secondary | ICD-10-CM | POA: Diagnosis present

## 2023-02-28 DIAGNOSIS — R252 Cramp and spasm: Secondary | ICD-10-CM | POA: Diagnosis present

## 2023-02-28 MED ORDER — TIZANIDINE HCL 4 MG PO TABS: 4.0000 mg | ORAL_TABLET | Freq: Two times a day (BID) | ORAL | 3 refills | Status: DC

## 2023-02-28 MED ORDER — LUBIPROSTONE 24 MCG PO CAPS: 24.0000 ug | ORAL_CAPSULE | Freq: Two times a day (BID) | ORAL | 11 refills | Status: AC

## 2023-02-28 MED ORDER — SILVER SULFADIAZINE 1 % EX CREA: 1.0000 | TOPICAL_CREAM | Freq: Two times a day (BID) | CUTANEOUS | 11 refills | Status: DC

## 2023-02-28 MED ORDER — POLYETHYLENE GLYCOL 3350 17 G PO PACK: 17.0000 g | PACK | Freq: Every day | ORAL | 11 refills | Status: DC

## 2023-02-28 NOTE — Patient Instructions (Addendum)
 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 Also has HTN and borderline DM. Also has neurogenic bowel and bladder and spasticity with pain pain and ITB pump- managed for pain as well- Dr Cornelius.    Here for f/u on SCI.    Educated on sitting up in bed and in w/c-  that's why BP can be lower- this is normal with SCI patients.  If sitting up and it's too low, symptomatic, have him lay back as far as possible in w/c- they should help.    2.  Will write for Miralax  for neurogenic bowel- has been working since last in hospital and got cleaned out from being very backed up/constipated- so will continue- think it's necessary to keep him without being backed up.    3.  Needs Rx for rental- hospital bed.  Will write Rx-  wouldn't be covered by worker's compensation.    4.  Dr Cornelius manages pt's Intrathecal pump for pain and spasticity. Pt's spasticity is well controlled, so doesn't need titration.    5. Takes Tizanidine - 4 mg BID- con't regimen- needs refills. Sending in refills for 1 year.    6. Con't Silvadene  400 grams/month- 11 refills  7. Con't Aimitiza 24 mg 2x/day- #60- 11 refills  8.  Spoke with worker's comp on plan  9. F/U in 3months- double appt- SCI  10. As we discussed about UTI's- con't to follow with ID- this is common in SCI's- that have been SCI for prolonged period.  There's fewer choices to treat UTI due to increased resistance-  continue regimen- stopped Macrobid for prevention since wasn't working for him.  At some point, will need hospitalization for IV ABX, or to arrange at home.

## 2023-02-28 NOTE — Progress Notes (Signed)
 Subjective:    Patient ID: Robert Mays, male    DOB: 05-16-1955, 68 y.o.   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 Also has HTN and borderline DM. Also has neurogenic bowel and bladder and spasticity with pain pain and ITB pump- managed for pain as well- Dr Cornelius.    Here for f/u on SCI.    Fighting another UTI-  Is on ABX- Fosfomycin- seeing Dr Lindia.  Has one every 4 weeks lately.  Trying to keep out of hospital- last time was late October-  Back to back.   Always takes BP- in bed- but BP lower when sitting up- educated on this.    Is too low, has him do his exercises- doesn't feel dizzy or lightheaded when BP in 90's.  But makes him sleepy- sleeps too much.    Was backed up when in hospital last night- gave him mg citrate in hospital- was cleaned out really well .   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    Intrathecal pump was filled beginning of January- usually gets filled ~ 3 months.   Feels like spasticity about the same- doesn't change.  Controlled somewhat.  Legs really tight esp when tries to move them.     Pain Inventory Average Pain 7 Pain Right Now 7 My pain is sharp, burning, dull, stabbing, tingling, aching, and stinging  In the last 24 hours, has pain interfered with the following? General activity 5 Relation with others 0 Enjoyment of life 9 What TIME of day is your pain at its worst? varies Sleep (in general) Fair  Pain is worse with:  riding in car, vibrations Pain improves with: rest Relief from Meds:  na  Family History  Problem Relation Age of Onset   Heart attack Father         Died in his 62's   Social History   Socioeconomic History   Marital status: Married    Spouse name: Not on file   Number of children: Not on file   Years of education: Not on file   Highest education level: Not on file  Occupational History   Not on file  Tobacco Use   Smoking status: Never    Passive exposure: Never   Smokeless tobacco: Current    Types: Chew  Vaping Use   Vaping status: Never Used  Substance and Sexual Activity   Alcohol use: No   Drug use: No   Sexual activity: Not Currently  Other Topics Concern   Not on file  Social History Narrative   Not on file   Social Drivers of Health   Financial Resource Strain: Patient Declined (09/18/2021)   Received from [facility], [facility]   Overall Financial Resource Strain (CARDIA)    Difficulty of Paying Living Expenses: Patient declined  Food Insecurity: Low Risk  (11/16/2022)   Received from [facility]   Hunger Vital Sign    Worried About Running Out of Food in the Last Year: Never true    Ran Out of Food in the Last Year: Never true  Transportation Needs:  No Transportation Needs (11/16/2022)   Received from [facility]    In the past 12 months, has lack of reliable transportation kept you from medical appointments, meetings, work or from getting things needed for daily living? : No  Physical Activity: Unknown (09/18/2021)   Received from [facility], [facility]   Exercise Vital Sign    Days of Exercise per Week: 0 days    Minutes of Exercise per Session: Not on file  Stress: Patient Declined (09/18/2021)   Received from [facility], [facility] - Occupational Stress Questionnaire    Feeling of Stress : Patient declined  Social Connections: Unknown (09/30/2022)   Received from [facility]   Social Network    Social Network: Not on file   Past Surgical History:  Procedure Laterality Date   ABDOMINAL SURGERY     BACK SURGERY      CYSTOSCOPY W/ URETERAL STENT PLACEMENT Right 02/22/2018   Procedure: CYSTOSCOPY, RIGHT RETROGRADE PYELOGRAM WITH URETERAL STENT PLACEMENT;  Surgeon: Gaston Hamilton, MD;  Location: [facility];  Service: Urology;  Laterality: Right;   CYSTOSCOPY/URETEROSCOPY/HOLMIUM LASER/STENT PLACEMENT Right 03/18/2018   Procedure: CYSTOSCOPY/URETEROSCOPY/HOLMIUM LASER/STENT PLACEMENT;  Surgeon: Carolee Sherwood JONETTA DOUGLAS, MD;  [facility];  Service: Urology;  Laterality: Right;   INTRATHECAL PUMP IMPLANTATION Left 2000  ?   baclofin,clonidine,morphine infusion   Past Surgical History:  Procedure Laterality Date   ABDOMINAL SURGERY     BACK SURGERY     CYSTOSCOPY W/ URETERAL STENT PLACEMENT Right 02/22/2018   Procedure: CYSTOSCOPY, RIGHT RETROGRADE PYELOGRAM WITH URETERAL STENT PLACEMENT;  Surgeon: Gaston Hamilton, MD;  Location: [facility];  Service: Urology;  Laterality: Right;   CYSTOSCOPY/URETEROSCOPY/HOLMIUM LASER/STENT PLACEMENT Right 03/18/2018   Procedure: CYSTOSCOPY/URETEROSCOPY/HOLMIUM LASER/STENT PLACEMENT;  Surgeon: Carolee Sherwood JONETTA DOUGLAS, MD;  Location: [facility];  Service: Urology;  Laterality: Right;   INTRATHECAL PUMP IMPLANTATION Left 2000  ?   baclofin,clonidine,morphine infusion   Past Medical History:  Diagnosis Date   Candida cystitis 02/08/2022   Cellulitis and abscess of left leg 07/2016   Chronic back pain    Depression    H/O spinal cord injury    Headache    Lower extremity edema 09/13/2022   Recurrent UTI 09/13/2022   Sepsis (HCC) 07/2016   BP 98/66   Pulse 90   Ht 5' 6 (1.676 m)   Wt 215 lb (97.5 kg)   SpO2 95%   BMI 34.70 kg/m   Opioid Risk Score:   Fall Risk Score:  `1  Depression screen [facility] 2/9     02/07/2023   11:15 AM 11/22/2022   10:00 AM 08/21/2022    9:53 AM 05/22/2022    9:10 AM 04/19/2022   10:37 AM 07/22/2021    9:42 AM 09/06/2020   10:08 AM  Depression screen PHQ 2/9  Decreased Interest 0 1 1 1 1 1 1   Down, Depressed,  Hopeless 0 1 1 1  0 1 1  PHQ - 2 Score 0 2 2 2 1 2 2   Altered sleeping       1  Tired, decreased energy       3  Change in appetite       1  Feeling bad or failure about yourself        0  Trouble concentrating       2  Moving slowly or fidgety/restless       0  Suicidal  thoughts       0  PHQ-9 Score       9    Review of Systems  Musculoskeletal:        Pain in chest down to tip or toes  Neurological:  Positive for weakness and numbness.  All other systems reviewed and are negative.      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 Also has HTN and borderline DM. Also has neurogenic bowel and bladder and spasticity with pain pain and ITB pump- managed for pain as well- Dr Cornelius.    Here for f/u on SCI.    Educated on sitting up in bed and in w/c-  that's why BP can be lower- this is normal with SCI patients.  If sitting up and it's too low, symptomatic, have him lay back as far as possible in w/c- they should help.   2.  Will write for Miralax  for neurogenic bowel- has been working since last in hospital and got cleaned out from being very backed up/constipated- so will continue- think it's necessary to keep him without being backed up.    3.  Needs Rx for rental- hospital bed.  Will write Rx-  wouldn't be covered by worker's compensation.    4.  Dr Cornelius manages pt's Intrathecal pump for pain and spasticity. Pt's spasticity is well controlled, so doesn't need titration.    5. Takes Tizanidine - 4 mg BID- con't regimen-  needs refills. Sending in refills for 1 year.    6. Con't Silvadene  400 grams/month- 11 refills  7. Con't Aimitiza 24 mg 2x/day- #60- 11 refills  8.  Spoke with worker's comp on plan  9. F/U in 3 months- double appt- SCI  10. As we discussed about UTI's- con't to follow with ID- this is common in SCI's- that have been SCI for prolonged period.  There's fewer choices to treat UTI due to increased resistance-  continue regimen- stopped Macrobid for prevention since wasn't working for him.  At some point, will need hospitalization for IV ABX, or to arrange at home.     I spent a total of  41  minutes on total care today- >50% coordination of care- due to eduction on miralax - UTI's as well as low BP and treatment as detailed above- also spoke with worker's comp.

## 2023-03-08 ENCOUNTER — Telehealth: Payer: Self-pay

## 2023-03-08 DIAGNOSIS — N390 Urinary tract infection, site not specified: Secondary | ICD-10-CM

## 2023-03-08 MED ORDER — CIPROFLOXACIN HCL 500 MG PO TABS: 500.0000 mg | ORAL_TABLET | Freq: Two times a day (BID) | ORAL | 0 refills | Status: AC

## 2023-03-08 NOTE — Telephone Encounter (Addendum)
Spoke with patient's wife, Arline Asp, relayed Dr. Daiva Eves does not recommend treating at this point. She would like to have a 3-day course of ciprofloxacin on hand in case he develops fever/chills over the weekend. Verified with Dr. Daiva Eves that it should be 3-day course.  Notified Cindy of DDI with tizanidine and cipro and possibility for increased drowsiness.     Sandie Ano, RN

## 2023-03-08 NOTE — Addendum Note (Signed)
Addended by: Linna Hoff D on: 03/08/2023 04:46 PM   Modules accepted: Orders

## 2023-03-08 NOTE — Telephone Encounter (Signed)
Spoke with patient's spouse, Arline Asp. She says Xzavion has "not really" had any symptoms. Denies fever. Says he didn't feel great yesterday. Reports urine is cloudy with white debris and looks "thick." States his mental status is about the same.   Will route to provider.   Sandie Ano, RN

## 2023-03-08 NOTE — Telephone Encounter (Signed)
Received voicemail stating [facility] was faxing over culture report. Results received and uploaded to chart for provider to review.   Sandie Ano, RN

## 2023-03-21 ENCOUNTER — Ambulatory Visit: Payer: Medicare Other | Admitting: Infectious Disease

## 2023-05-16 ENCOUNTER — Ambulatory Visit: Payer: Medicare Other | Admitting: Infectious Disease

## 2023-05-30 ENCOUNTER — Encounter: Payer: Self-pay | Admitting: Physical Medicine and Rehabilitation

## 2023-05-30 ENCOUNTER — Encounter
Payer: Worker's Compensation | Attending: Physical Medicine and Rehabilitation | Admitting: Physical Medicine and Rehabilitation

## 2023-05-30 VITALS — BP 96/73 | HR 81

## 2023-05-30 DIAGNOSIS — G950 Syringomyelia and syringobulbia: Secondary | ICD-10-CM | POA: Insufficient documentation

## 2023-05-30 DIAGNOSIS — G8220 Paraplegia, unspecified: Secondary | ICD-10-CM | POA: Insufficient documentation

## 2023-05-30 DIAGNOSIS — Z993 Dependence on wheelchair: Secondary | ICD-10-CM | POA: Insufficient documentation

## 2023-05-30 DIAGNOSIS — R252 Cramp and spasm: Secondary | ICD-10-CM | POA: Insufficient documentation

## 2023-05-30 DIAGNOSIS — K592 Neurogenic bowel, not elsewhere classified: Secondary | ICD-10-CM | POA: Insufficient documentation

## 2023-05-30 NOTE — Progress Notes (Deleted)
 Subjective:    Patient ID: Robert Mays, male    DOB: [birth year], 68 y.o.   MRN: [id number]  HPI Pain Inventory Average Pain {NUMBERS; 0-10:5044} Pain Right Now {NUMBERS; 0-10:5044} My pain is {PAIN DESCRIPTION:21022940}  In the last 24 hours, has pain interfered with the following? General activity {NUMBERS; 0-10:5044} Relation with others {NUMBERS; 0-10:5044} Enjoyment of life {NUMBERS; 0-10:5044} What TIME of day is your pain at its worst? {time of day:24191} Sleep (in general) {BHH GOOD/FAIR/POOR:22877}  Pain is worse with: {ACTIVITIES:21022942} Pain improves with: {PAIN IMPROVES NUUV:25366440} Relief from Meds: {NUMBERS; 0-10:5044}  Family History  Problem Relation Age of Onset   Heart attack Father        Died in his 25's   Social History   Socioeconomic History   Marital status: Married    Spouse name: Not on file   Number of children: Not on file   Years of education: Not on file   Highest education level: Not on file  Occupational History   Not on file  Tobacco Use   Smoking status: Never    Passive exposure: Never   Smokeless tobacco: Current    Types: Chew  Vaping Use   Vaping status: Never Used  Substance and Sexual Activity   Alcohol use: No   Drug use: No   Sexual activity: Not Currently  Other Topics Concern   Not on file  Social History Narrative   Not on file   Social Drivers of Health   Financial Resource Strain: Patient Declined (09/18/2021)   Received from [facility], [facility]   Overall Financial Resource Strain (CARDIA)    Difficulty of Paying Living Expenses: Patient declined  Food Insecurity: Low Risk  (11/16/2022)   Received from [facility]   Hunger Vital Sign    Worried About Running Out of Food in the Last Year: Never true    Ran Out of Food in the Last Year: Never true  Transportation Needs: No Transportation Needs (11/16/2022)   Received from [facility]    In the past 12 months, has lack of  reliable transportation kept you from medical appointments, meetings, work or from getting things needed for daily living? : No  Physical Activity: Unknown (09/18/2021)   Received from [facility], [facility]   Exercise Vital Sign    Days of Exercise per Week: 0 days    Minutes of Exercise per Session: Not on file  Stress: Patient Declined (09/18/2021)   Received from [facility], [facility] - Occupational Stress Questionnaire    Feeling of Stress : Patient declined  Social Connections: Unknown (09/30/2022)   Received from [facility]   Social Network    Social Network: Not on file   Past Surgical History:  Procedure Laterality Date   ABDOMINAL SURGERY     BACK SURGERY     CYSTOSCOPY W/ URETERAL STENT PLACEMENT Right 02/22/2018   Procedure: CYSTOSCOPY, RIGHT RETROGRADE PYELOGRAM WITH URETERAL STENT PLACEMENT;  Surgeon: Erman Hayward, MD;  Location: [facility];  Service: Urology;  Laterality: Right;   CYSTOSCOPY/URETEROSCOPY/HOLMIUM LASER/STENT PLACEMENT Right 03/18/2018   Procedure: CYSTOSCOPY/URETEROSCOPY/HOLMIUM LASER/STENT PLACEMENT;  Surgeon: Samson Croak, MD;  [facility];  Service: Urology;  Laterality: Right;   INTRATHECAL PUMP IMPLANTATION Left 2000  ?   baclofin,clonidine,morphine infusion   Past Surgical History:  Procedure Laterality Date   ABDOMINAL SURGERY     BACK SURGERY  CYSTOSCOPY W/ URETERAL STENT PLACEMENT Right 02/22/2018   Procedure: CYSTOSCOPY, RIGHT RETROGRADE PYELOGRAM WITH URETERAL STENT PLACEMENT;  Surgeon: Erman Hayward, MD;  Location: [facility];  Service: Urology;  Laterality: Right;   CYSTOSCOPY/URETEROSCOPY/HOLMIUM LASER/STENT PLACEMENT Right 03/18/2018   Procedure: CYSTOSCOPY/URETEROSCOPY/HOLMIUM LASER/STENT PLACEMENT;  Surgeon: Samson Croak, MD;  Location: [facility];  Service: Urology;  Laterality: Right;   INTRATHECAL PUMP IMPLANTATION Left 2000   ?   baclofin,clonidine,morphine infusion   Past Medical History:  Diagnosis Date   Candida cystitis 02/08/2022   Cellulitis and abscess of left leg 07/2016   Chronic back pain    Depression    H/O spinal cord injury    Headache    Lower extremity edema 09/13/2022   Recurrent UTI 09/13/2022   Sepsis (HCC) 07/2016   There were no vitals taken for this visit.  Opioid Risk Score:   Fall Risk Score:  `1  Depression screen [facility] 2/9     02/07/2023   11:15 AM 11/22/2022   10:00 AM 08/21/2022    9:53 AM 05/22/2022    9:10 AM 04/19/2022   10:37 AM 07/22/2021    9:42 AM 09/06/2020   10:08 AM  Depression screen PHQ 2/9  Decreased Interest 0 1 1 1 1 1 1   Down, Depressed, Hopeless 0 1 1 1  0 1 1  PHQ - 2 Score 0 2 2 2 1 2 2   Altered sleeping       1  Tired, decreased energy       3  Change in appetite       1  Feeling bad or failure about yourself        0  Trouble concentrating       2  Moving slowly or fidgety/restless       0  Suicidal thoughts       0  PHQ-9 Score       9       Review of Systems     Objective:   Physical Exam        Assessment & Plan:

## 2023-05-30 NOTE — Patient Instructions (Signed)
 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 Also has HTN and borderline DM. Also has neurogenic bowel and bladder and spasticity with pain pain and ITB pump- managed for pain as well- Dr Helaine Llanos.    Here for f/u on SCI.      Need to figure out where MRI was done before-was done in [facility]-  will reorder MRI of Cervical and Thoracic spine-   2. Needs to see Dr Val Garin office to have pump be re-interrogated to make sure doesn't stay stopped after MRI.  Needs to coordinator this after MRI.     3.  W/C repairs need to be done- for fine motor control not good anymore- Needs L arm rest pushed out more- pushing on ITB pump and irritates it- rubbing- really hurts- has gained significant weight; also R thigh support has broken off.  Will write Rx for w/c repairs-   Give Rx to worker's comp for [employer] to repair w/c.   4.  Have to work on abdominal  weight gain- have ot decrease calorie intake, unfortunately there isn't another way.  We went over that when has ITB pump, weight gained goes OVER pump so also harder to fill.  Can actually make the pump flip in worst case scenario.    5.  Went over issues with Worker's Comp  6. Attempted to call [employer]- to discuss with pt about w/c repairs.    7. Still not using chewing tobacco! Made it 1 year!   8. Con't Miralax - has refills  9. Con't Amitiza - doesn't need refills.    10. Con't Tizanidine  -last refill 02/28/23- doesn't need refills.    11. Con't Silvadene - last refill 02/28/23- doesn't need refills today.    12.F/U in 3 months double appt.

## 2023-05-30 NOTE — Progress Notes (Signed)
 .cpr  Subjective:    Patient ID: Robert Mays, male    DOB: [birth year], 68 y.o.   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 Also has HTN and borderline DM. Also has neurogenic bowel and bladder and spasticity with pain pain and ITB pump- managed for pain as well- Dr Helaine Llanos.    Here for f/u on SCI.      Has been having changes.  Neck was popping real bad- in last few days, popping has been a little better.   Recovering from pneumonia- - was at beach- started at when at beach last Tuesday- came home Thursday night- could hear across the room.  Was on ABX -changed to Levaquin for UTI Had to increase breathing treatments- to 4x/day- for 3 days- still wheezing quite a bit. Placed on Prednisone- 1- days of Levaquin and added an inhaler- nebs get HR up to 140's-    Started 8 days ago-  and wife concerned that still coughing- thinks it's taking longer than "normal".    Having several issues with W/C If rides with arm down on L side, rubs on ITB pump on L side- so cannot keep arm down  Issue with joystick- struggling- wheels won't turn with joystick as delicately as should be- used to work well, but fine motor has decreased.  -  R thigh support has broken off power w/c.    No new weakness in arms per pt.  Just the popping in his neck-  Does pull ups on bed- to keep muscles strong-     PCP thinks was aspiration pneumonia when got it-    Pain Inventory Average Pain 7 Pain Right Now 7 My pain is burning, stabbing, and tingling  LOCATION OF PAIN  Neck, thigh, back, buttocks, groin, hip, knee, leg, ankle, toes  BOWEL Number of stools per week: 6-10 week Oral laxative use Yes  Type of laxative miralax  Enema or  suppository use No  History of colostomy No  Incontinent No   BLADDER Pads In and out cath, frequency no Able to self cath No  Bladder incontinence Yes  Frequent urination  N/A Leakage with coughing  N/A Difficulty starting stream  N/A Incomplete bladder emptying  N/A   Mobility use a wheelchair  Function disabled: date disabled    Neuro/Psych weakness numbness tremor tingling spasms confusion depression  Prior Studies No  Physicians involved in your care Primary care  PCP, Pain Management, Infectious Disease, Neurology    Family History  Problem Relation Age of Onset   Heart attack Father        Died in his 41's   Social History   Socioeconomic History   Marital status: Married    Spouse name: Not on file   Number of children: Not on file   Years of education: Not on file   Highest education level: Not on file  Occupational History   Not on file  Tobacco Use   Smoking status: Never    Passive exposure: Never   Smokeless tobacco: Current    Types: Chew  Vaping Use   Vaping status: Never Used  Substance and Sexual Activity   Alcohol use: No   Drug use: No  Sexual activity: Not Currently  Other Topics Concern   Not on file  Social History Narrative   Not on file   Social Drivers of Health   Financial Resource Strain: Patient Declined (09/18/2021)   Received from [facility], [facility]   Overall Financial Resource Strain (CARDIA)    Difficulty of Paying Living Expenses: Patient declined  Food Insecurity: Low Risk  (11/16/2022)   Received from [facility]   Hunger Vital Sign    Worried About Running Out of Food in the Last Year: Never true    Ran Out of Food in the Last Year: Never true  Transportation Needs: No Transportation Needs (11/16/2022)   Received from [facility]    In the past 12 months, has lack of reliable transportation kept you from medical appointments, meetings, work or from getting things  needed for daily living? : No  Physical Activity: Unknown (09/18/2021)   Received from [facility], [facility]   Exercise Vital Sign    Days of Exercise per Week: 0 days    Minutes of Exercise per Session: Not on file  Stress: Patient Declined (09/18/2021)   Received from [facility], [facility] of Occupational Health - Occupational Stress Questionnaire    Feeling of Stress : Patient declined  Social Connections: Unknown (09/30/2022)   Received from [facility]   Social Network    Social Network: Not on file   Past Surgical History:  Procedure Laterality Date   ABDOMINAL SURGERY     BACK SURGERY     CYSTOSCOPY W/ URETERAL STENT PLACEMENT Right 02/22/2018   Procedure: CYSTOSCOPY, RIGHT RETROGRADE PYELOGRAM WITH URETERAL STENT PLACEMENT;  Surgeon: Erman Hayward, MD;  Location: [facility];  Service: Urology;  Laterality: Right;   CYSTOSCOPY/URETEROSCOPY/HOLMIUM LASER/STENT PLACEMENT Right 03/18/2018   Procedure: CYSTOSCOPY/URETEROSCOPY/HOLMIUM LASER/STENT PLACEMENT;  Surgeon: Samson Croak, MD;  Location: [facility];  Service: Urology;  Laterality: Right;   INTRATHECAL PUMP IMPLANTATION Left 2000  ?   baclofin,clonidine,morphine infusion   Past Medical History:  Diagnosis Date   Candida cystitis 02/08/2022   Cellulitis and abscess of left leg 07/2016   Chronic back pain    Depression    H/O spinal cord injury    Headache    Lower extremity edema 09/13/2022   Recurrent UTI 09/13/2022   Sepsis (HCC) 07/2016   BP 96/73 (BP Location: Left Arm, Cuff Size: Large)   Pulse 81   SpO2 92%   Opioid Risk Score:   Fall Risk Score:  `1  Depression screen [facility] 2/9     02/07/2023   11:15 AM 11/22/2022   10:00 AM 08/21/2022    9:53 AM 05/22/2022    9:10 AM 04/19/2022   10:37 AM 07/22/2021    9:42 AM 09/06/2020   10:08 AM  Depression screen PHQ 2/9  Decreased Interest 0 1 1 1 1 1 1   Down, Depressed, Hopeless 0 1 1 1  0 1 1  PHQ - 2 Score 0  2 2 2 1 2 2   Altered sleeping       1  Tired, decreased energy       3  Change in appetite       1  Feeling bad or failure about yourself        0  Trouble concentrating       2  Moving slowly or fidgety/restless       0  Suicidal thoughts  0  PHQ-9 Score       9     Review of Systems  Musculoskeletal:  Positive for arthralgias, joint swelling, myalgias and neck stiffness.  Neurological:  Positive for weakness and numbness.       Paralysis  All other systems reviewed and are negative.      Objective:   Physical Exam  Awake, alert, appropriate, poor memory; in power w/c; accompanied by wife, NAD L arm rest when down rubs directly on his ITB pump hhas gained weight  Rotated to left in w/c. With L side of abdomen rubbing more on L arm rest than would normally- (wife reports cannot turn him as much in w/c like used to)    Ue's strength 5-/5 in biceps, triceps, WE, grip and FA- looks good LE's 0/5 B/L  TTP over spine itself in cervical spine- mild- no popping felt today.   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 Also has HTN and borderline DM. Also has neurogenic bowel and bladder and spasticity with pain pain and ITB pump- managed for pain as well- Dr Helaine Llanos.    Here for f/u on SCI.      Need to figure out where MRI was done before-was done in [facility]-  will reorder MRI of Cervical and Thoracic spine-   2. Needs to see Dr Val Garin office to have pump be re-interrogated to make sure doesn't stay stopped after MRI.  Needs to coordinator this after MRI.     3.  W/C repairs need to be done- for fine motor control not good anymore- Needs L  arm rest pushed out more- pushing on ITB pump and irritates it- rubbing- really hurts- has gained significant weight; also R thigh support has broken off.  Will write Rx for w/c repairs-   Give Rx to worker's comp for [employer] to repair w/c.   4.  Have to work on abdominal  weight gain- have ot decrease calorie intake, unfortunately there isn't another way.  We went over that when has ITB pump, weight gained goes OVER pump so also harder to fill.  Can actually make the pump flip in worst case scenario.    5.  Went over issues with Worker's Comp  6. Attempted to call [employer] [employer]- to discuss with pt about w/c repairs.    7. Still not using chewing tobacco! Made it 1 year!   8. Con't Miralax - has refills  9. Con't Amitiza - doesn't need refills.    10. Con't Tizanidine  -last refill 02/28/23- doesn't need refills.    11. Con't Silvadene - last refill 02/28/23- doesn't need refills today.    12.F/U in 3 months double appt.      I spent a total of  41  minutes on total care today- >50% coordination of care- due to  d/w pt and wife and worker's comp about MRI- needs to f/u on syrinx- as wlel as w/c repairs he needs. Wrote Rx for both- since cannot order inside [facility]- [place] done [place]

## 2023-06-01 ENCOUNTER — Telehealth: Payer: Self-pay | Admitting: Physical Medicine and Rehabilitation

## 2023-06-01 NOTE — Telephone Encounter (Signed)
 Kels[facility]- [facility] stated the 2nd mri order that was received they are unable to read signature and the icd code isnt listed. This would be needed in order for the pt to be scheduled  Fax: [phone]

## 2023-06-09 DIAGNOSIS — R0602 Shortness of breath: Secondary | ICD-10-CM | POA: Diagnosis not present

## 2023-06-13 ENCOUNTER — Ambulatory Visit: Admitting: Infectious Disease

## 2023-06-26 ENCOUNTER — Encounter: Payer: Self-pay | Admitting: Infectious Disease

## 2023-06-26 DIAGNOSIS — R399 Unspecified symptoms and signs involving the genitourinary system: Secondary | ICD-10-CM

## 2023-06-26 HISTORY — DX: Unspecified symptoms and signs involving the genitourinary system: R39.9

## 2023-06-26 NOTE — Progress Notes (Unsigned)
 Subjective:   Chief complaint: follow-up for recurrent UTI's, bateruria   Patient ID: Robert Mays, male    DOB: 1955/03/21, 68 y.o.   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  He has also suffered consequent neurogenic bladder with ncontinence, and spasciticity. He also has multiple kidney stones that hass undergone multiple interventions with Urology.   He has suffered recurrent urinary tract infections and according to the patient and his wife the majority of the recent ones have all been fungal urinary tract infections.   Prior history from notes:  "  One of the great difficulty of course for this patient is that he does not have much in the way of symptoms until his infections have progressed.  His wife says that once he starts becoming a little bit more fatigued and lethargic it progresses fairly rapidly.  We discussed that typically the treatment for fungal infection of the bladder is exchange of the catheter.  However it sounds like he is getting systemically ill prior to this being something that is possible.  It also sounds as if he was  getting fungal infections because he is given frequent antibiotics for recurrent pneumonias due to his paralysis and inability to fully ventilate his lungs.  Has his catheter to exchanged twice per month by urology currently.   I did NOT have access to his records from and off health and we will request release of information from [facility].  Cultures with [facility] include cultures from December 19 which had 16,000 colony-forming units of yeast cultures from December 01, 2021 did not yield any organism cultures from November 15, 2021 yielded 90,000 colony-forming's of  diphtheroids cultures from November 03, 2021 yielded 10,000 colony-forming units of yeast cultures from December 20, 2021 yielded 100,000 colony-forming units of yeast cultures from October 05, 2021 yielded no organisms   f he truly is having recurrent urinary tract infections during due to yeast which seems to be the story here I t thought the following things can be done to try to help his reduce hospitalizations is the following  #1 when he is prescribed an antibacterial antibiotic he is to take fluconazole  100mg  daily  #2 when he has the very beginnings of a UTI he is to start 200mg  of fluconazole  and call Urology to have his catheter exchanged  Note I had given him prescriptions for fluconazole  with 28 pills and I wanted to have fluconazole  at home on hand at all times to cut down on the rate limiting steps on him getting antifungal therapy when he has a fungal urinary tract infection.  I would also suggest the possibility of him having a suprapubic catheter which he has wife said was never brought up to them before.  He was  working with Dr Raynaldo Call to reduce risk of aspiration pneumonia.  S        Is not clear to me that he had symptoms though this is difficult to assess in context of his paraplegia.  He was given antibacterial antibiotics followed by antifungals yet again.    I last saw him he had urinary tract infection diagnosed with [facility] and was prescribed Macrobid and then a second antibiotic but failed to improve and  deteriorated and ultimately was hospitalized at [facility] where he received IV antibiotics improvement in his symptoms.    Last visit he has had at least 2 hospitalizations at [facility].  He is also had cultures checked with [facility] even when he is not having symptoms.  They have also been treating these asymptomatic bacteriuria which I really do not understand as it has no impact on his hospitalization rate.  His wife is  not able to ever get him into a clinician fast enough when he starts to first develop signs of an infection and typically develops encephalopathy.  Has been giving him fluconazole  after he completes antimicrobial antibiotics.   Since I last saw him he had urine cultured at [facility] but did not have antibiotics called in until the organism was identified and culture data seen and reviewed by the physician with ultimately levofloxacin having been called in though this appears to have been an ampicillin sensitive Enterococcus faecalis.  In any case he was hospitalized at [facility] yet again.  His wife was  concerned that he is developing an or new urinary tract infection due to the change in the color and also becoming slightly more subtly tired."   Since I last saw the patient he had cultures done at [facility] urology in December.  He appears to have only grown about 40,000 colony-forming units of a bacteria that did not look consistent with being a pathogen based on colony count alone.  He has been antibiotic course for 7 days and was treated with ciprofloxacin  which was active against the organism.  I did not recommend more antibiotics beyond 7 days and would not of recommended them.  Based on the paucity of bacteria in the urine.  Since I last saw him he had a urine culture with catheter exchange performed in early January  See below      Bacteria was only sensitive to IV antibiotics as well as doxycycline and Bactrim .  He is allergic to Bactrim  and was prescribed doxycycline.  Scearce me the doxycycline is even listed as an agent since it does not penetrate well into the bladder in any case the patient is better after having been treated with doxycycline and has had no problems in the ensuing weeks.     Past Medical History:  Diagnosis Date   Candida cystitis 02/08/2022   Cellulitis and abscess of left leg 07/2016   Chronic back pain    Depression    H/O spinal  cord injury    Headache    Lower extremity edema 09/13/2022   Recurrent UTI 09/13/2022   Sepsis (HCC) 07/2016    Past Surgical History:  Procedure Laterality Date   ABDOMINAL SURGERY     BACK SURGERY     CYSTOSCOPY W/ URETERAL STENT PLACEMENT Right 02/22/2018   Procedure: CYSTOSCOPY, RIGHT RETROGRADE PYELOGRAM WITH URETERAL STENT PLACEMENT;  Surgeon: Erman Hayward, MD;  Location: [facility];  Service: Urology;  Laterality: Right;   CYSTOSCOPY/URETEROSCOPY/HOLMIUM LASER/STENT PLACEMENT Right 03/18/2018   Procedure: CYSTOSCOPY/URETEROSCOPY/HOLMIUM LASER/STENT PLACEMENT;  Surgeon: Samson Croak, MD;  Location: [facility];  Service: Urology;  Laterality: Right;   INTRATHECAL PUMP IMPLANTATION Left 2000  ?   baclofin,clonidine,morphine infusion    Family History  Problem Relation Age of Onset   Heart attack Father        Died in his 9's      Social History   Socioeconomic History   Marital status: Married  Spouse name: Not on file   Number of children: Not on file   Years of education: Not on file   Highest education level: Not on file  Occupational History   Not on file  Tobacco Use   Smoking status: Never    Passive exposure: Never   Smokeless tobacco: Current    Types: Chew  Vaping Use   Vaping status: Never Used  Substance and Sexual Activity   Alcohol use: No   Drug use: No   Sexual activity: Not Currently  Other Topics Concern   Not on file  Social History Narrative   Not on file   Social Drivers of Health   Financial Resource Strain: Patient Declined (09/18/2021)   Received from [facility], [facility]   Overall Financial Resource Strain (CARDIA)    Difficulty of Paying Living Expenses: Patient declined  Food Insecurity: Low Risk  (11/16/2022)   Received from [facility]   Hunger Vital Sign    Worried About Running Out of Food in the Last Year: Never true    Ran Out of Food in the Last Year: Never true  Transportation Needs:  No Transportation Needs (11/16/2022)   Received from [facility]    In the past 12 months, has lack of reliable transportation kept you from medical appointments, meetings, work or from getting things needed for daily living? : No  Physical Activity: Unknown (09/18/2021)   Received from [facility], [facility]   Exercise Vital Sign    Days of Exercise per Week: 0 days    Minutes of Exercise per Session: Not on file  Stress: Patient Declined (09/18/2021)   Received from [facility], [facility] of Occupational Health - Occupational Stress Questionnaire    Feeling of Stress : Patient declined  Social Connections: Unknown (09/30/2022)   Received from [facility]   Social Network    Social Network: Not on file    Allergies  Allergen Reactions   Sulfa  Antibiotics Rash     Current Outpatient Medications:    albuterol  (PROVENTIL ) (2.5 MG/3ML) 0.083% nebulizer solution, Inhale into the lungs., Disp: , Rfl:    aspirin  EC 81 MG tablet, Take 81 mg by mouth daily., Disp: , Rfl:    atorvastatin (LIPITOR) 10 MG tablet, Take 1 tablet by mouth daily., Disp: , Rfl:    baclofen  (LIORESAL ) 20 MG tablet, Take 20 mg by mouth 3 (three) times daily., Disp: , Rfl: 0   cloNIDine HCl, Analgesia, (CLONIDINE HCL, BULK,) 250 MG/50ML SOLN, 92,003 mcg by Does not apply route daily. Given via intrathecal pain pump, Disp: , Rfl:    cyanocobalamin (VITAMIN B12) 1000 MCG tablet, Take 1,000 mcg by mouth daily., Disp: , Rfl:    dicyclomine (BENTYL) 10 MG capsule, TAKE 1 CAPSULE BY MOUTH EVERY 6 HOURS ASNEEDED, Disp: , Rfl:    fluconazole  (DIFLUCAN ) 100 MG tablet, Take 2 tabs x 14 days for UTI and have catheter changed by urology asap Take 1 tab daily while on antibacterial antibiotics, Disp: 28 tablet, Rfl: 5   fosfomycin (MONUROL ) 3 g PACK, 3 grams now and again in 2 days, Disp: 6 g, Rfl: 2   gabapentin  (NEURONTIN ) 300 MG capsule, Take 300 mg by mouth 3 (three) times  daily., Disp: , Rfl:    guaiFENesin  (GOODSENSE MUCUS ER) 600 MG 12 hr tablet, TAKE 1 TABLET BY MOUTH TWICE DAILY AS NEEDED., Disp: 180 tablet, Rfl: 3   losartan (COZAAR) 50 MG  tablet, Take 1 tablet by mouth daily., Disp: , Rfl:    lubiprostone  (AMITIZA ) 24 MCG capsule, TAKE 1 CAPSULE BY MOUTH TWICE DAILY WITHA MEAL FOR SEVERE NEUROGENIC BOWEL/ CONSTIPATION AND OPIOID INDUCED CONSTIPATION., Disp: 60 capsule, Rfl: 11   lubiprostone  (AMITIZA ) 24 MCG capsule, Take 1 capsule (24 mcg total) by mouth 2 (two) times daily with a meal. For neurogenic bowel, Disp: 60 capsule, Rfl: 11   methylPREDNISolone (MEDROL DOSEPAK) 4 MG TBPK tablet, Take 4 mg by mouth See admin instructions., Disp: , Rfl:    morphine 10 MG/5ML solution, Take by mouth every 2 (two) hours as needed for severe pain. Per patient, taking 1.804 mg/day via intrathecal pain pump, Disp: , Rfl:    naloxone (NARCAN) nasal spray 4 mg/0.1 mL, Place 1 spray into the nose once., Disp: , Rfl:    omeprazole (PRILOSEC) 20 MG capsule, Take 20 mg by mouth daily., Disp: , Rfl:    ondansetron  (ZOFRAN ) 8 MG tablet, Take 8 mg by mouth every 8 (eight) hours as needed for nausea/vomiting., Disp: , Rfl:    Oxcarbazepine  (TRILEPTAL ) 300 MG tablet, Take 300 mg by mouth 3 (three) times daily., Disp: , Rfl:    oxybutynin  (DITROPAN -XL) 10 MG 24 hr tablet, Take 20 mg by mouth., Disp: , Rfl:    oxycodone  (ROXICODONE ) 30 MG immediate release tablet, Take 30 mg by mouth 2 (two) times daily., Disp: , Rfl:    polyethylene glycol (MIRALAX ) 17 g packet, Take 17 g by mouth daily. For neurogenic bowel- along with Amitiza . If doesn't take, gets backed up., Disp: 30 each, Rfl: 11   Probiotic Product (PROBIOTIC DAILY PO), Take by mouth., Disp: , Rfl:    silver  sulfADIAZINE  (SILVADENE ) 1 % cream, Apply 1 Application topically 2 (two) times daily. For buttocks to heal wounds-, Disp: 400 g, Rfl: 11   tapentadol (NUCYNTA) 50 MG 12 hr tablet, Take by mouth every 12 (twelve) hours., Disp:  , Rfl:    tiZANidine  (ZANAFLEX ) 4 MG tablet, Take 1 tablet (4 mg total) by mouth in the morning and at bedtime., Disp: 180 tablet, Rfl: 3   triamcinolone (KENALOG) 0.025 % cream, Use 2 x/day for inflammation, Disp: , Rfl:    venlafaxine  (EFFEXOR ) 75 MG tablet, Take 75 mg by mouth 2 (two) times daily., Disp: , Rfl:    Review of Systems  Unable to perform ROS: Dementia       Objective:   Physical Exam        Assessment & Plan:  Recurrent UTII's:  Since he does not have any urinary symptoms due to his spinal cord injury it is difficult to ascertain when he is developing a urinary tract infection his wife noticed more cloudiness and sediment in the urine but this of course is not in itself a specific finding for urinary tract infection.  He then typically will become confused but again these symptoms of confusion are not specific to UTI.  Makes diagnosing these infections promptly problematic.  1 looks the last 2 urine cultures he had as well 1 had low colony forming counts which would not be consistent with an actual urinary tract infection and the other 1 had an organism that was treated with doxycycline which is an agent that does not penetrate the bladder.  I would argue that both of these instances may not abaxial been urinary tract infections but potentially just episodes of confusion and delirium superimposed on his chronic dementia.  That being said I would not dispute that  he does have actual urinary tract infections from time to time.  Will try to make the process of him getting cultures with [facility] and if treatment is needed coordination with them and ourselves more smooth I have asked that they always call our office whenever Dr. McDermott's went to prescribe an antibiotic to make sure that we agree with the antibiotic and that the cultures are sent to us  and that we know they are coming so I can review them in a timely manner Recurrent UTIs :  Recurrent candidal  infections:  He has been getting antifungal therapy after finishing antibacterial therapy this is probably "overkill" but the patient and wife feel more comfortable with this approach

## 2023-06-27 ENCOUNTER — Ambulatory Visit (INDEPENDENT_AMBULATORY_CARE_PROVIDER_SITE_OTHER): Payer: Worker's Compensation | Admitting: Infectious Disease

## 2023-06-27 ENCOUNTER — Other Ambulatory Visit: Payer: Self-pay

## 2023-06-27 ENCOUNTER — Encounter: Payer: Self-pay | Admitting: Infectious Disease

## 2023-06-27 VITALS — BP 111/74 | HR 71 | Resp 16 | Ht 66.0 in | Wt 215.0 lb

## 2023-06-27 DIAGNOSIS — Z09 Encounter for follow-up examination after completed treatment for conditions other than malignant neoplasm: Secondary | ICD-10-CM | POA: Diagnosis not present

## 2023-06-27 DIAGNOSIS — S24102S Unspecified injury at T2-T6 level of thoracic spinal cord, sequela: Secondary | ICD-10-CM

## 2023-06-27 DIAGNOSIS — Z87440 Personal history of urinary (tract) infections: Secondary | ICD-10-CM

## 2023-06-27 DIAGNOSIS — I4892 Unspecified atrial flutter: Secondary | ICD-10-CM

## 2023-06-27 DIAGNOSIS — I509 Heart failure, unspecified: Secondary | ICD-10-CM

## 2023-06-27 DIAGNOSIS — G894 Chronic pain syndrome: Secondary | ICD-10-CM

## 2023-06-27 DIAGNOSIS — E871 Hypo-osmolality and hyponatremia: Secondary | ICD-10-CM

## 2023-06-27 DIAGNOSIS — I951 Orthostatic hypotension: Secondary | ICD-10-CM

## 2023-06-27 DIAGNOSIS — J810 Acute pulmonary edema: Secondary | ICD-10-CM

## 2023-06-27 DIAGNOSIS — J189 Pneumonia, unspecified organism: Secondary | ICD-10-CM

## 2023-06-27 DIAGNOSIS — N319 Neuromuscular dysfunction of bladder, unspecified: Secondary | ICD-10-CM

## 2023-06-27 DIAGNOSIS — B3741 Candidal cystitis and urethritis: Secondary | ICD-10-CM

## 2023-06-27 DIAGNOSIS — J986 Disorders of diaphragm: Secondary | ICD-10-CM

## 2023-06-27 DIAGNOSIS — N179 Acute kidney failure, unspecified: Secondary | ICD-10-CM

## 2023-06-27 DIAGNOSIS — R399 Unspecified symptoms and signs involving the genitourinary system: Secondary | ICD-10-CM

## 2023-06-27 DIAGNOSIS — J9610 Chronic respiratory failure, unspecified whether with hypoxia or hypercapnia: Secondary | ICD-10-CM

## 2023-06-27 DIAGNOSIS — Z8619 Personal history of other infectious and parasitic diseases: Secondary | ICD-10-CM | POA: Diagnosis not present

## 2023-06-27 DIAGNOSIS — G950 Syringomyelia and syringobulbia: Secondary | ICD-10-CM

## 2023-06-27 DIAGNOSIS — Z993 Dependence on wheelchair: Secondary | ICD-10-CM

## 2023-06-27 DIAGNOSIS — G8220 Paraplegia, unspecified: Secondary | ICD-10-CM

## 2023-06-27 DIAGNOSIS — I482 Chronic atrial fibrillation, unspecified: Secondary | ICD-10-CM

## 2023-06-27 MED ORDER — FLUCONAZOLE 100 MG PO TABS: ORAL_TABLET | ORAL | 5 refills | Status: DC

## 2023-06-27 NOTE — Patient Instructions (Signed)
 Smoking Cessation: QuitlineNC 1-800-QUIT-NOW 707-701-6721); Espaol: 1-855-Djelo-Ya (1-780-445-4976) [email or web address]

## 2023-08-31 ENCOUNTER — Telehealth: Payer: Self-pay | Admitting: Physical Medicine and Rehabilitation

## 2023-08-31 NOTE — Telephone Encounter (Signed)
 Received his MRI's- shows mild increase in inferior part of large syrinx- to T12- however no increase in size up from C7- last time I spoke with him, he didn't want surgery unless things got worse clinically  I see no reason needs additional surgery at this time.

## 2023-09-07 ENCOUNTER — Encounter: Payer: Self-pay | Admitting: Physical Medicine and Rehabilitation

## 2023-09-07 ENCOUNTER — Encounter
Payer: Worker's Compensation | Attending: Physical Medicine and Rehabilitation | Admitting: Physical Medicine and Rehabilitation

## 2023-09-07 VITALS — BP 88/58 | HR 108 | Ht 66.0 in | Wt 169.0 lb

## 2023-09-07 DIAGNOSIS — J40 Bronchitis, not specified as acute or chronic: Secondary | ICD-10-CM | POA: Insufficient documentation

## 2023-09-07 DIAGNOSIS — G950 Syringomyelia and syringobulbia: Secondary | ICD-10-CM | POA: Insufficient documentation

## 2023-09-07 DIAGNOSIS — I951 Orthostatic hypotension: Secondary | ICD-10-CM | POA: Insufficient documentation

## 2023-09-07 DIAGNOSIS — J189 Pneumonia, unspecified organism: Secondary | ICD-10-CM | POA: Insufficient documentation

## 2023-09-07 DIAGNOSIS — G8221 Paraplegia, complete: Secondary | ICD-10-CM | POA: Insufficient documentation

## 2023-09-07 DIAGNOSIS — E871 Hypo-osmolality and hyponatremia: Secondary | ICD-10-CM | POA: Diagnosis present

## 2023-09-07 MED ORDER — GUAIFENESIN ER 600 MG PO TB12: 600.0000 mg | ORAL_TABLET | Freq: Two times a day (BID) | ORAL | 3 refills | Status: AC

## 2023-09-07 MED ORDER — MIDODRINE HCL 5 MG PO TABS: 5.0000 mg | ORAL_TABLET | Freq: Two times a day (BID) | ORAL | 5 refills | Status: AC | PRN

## 2023-09-07 NOTE — Progress Notes (Signed)
 Subjective:    Patient ID: Robert Mays, male    DOB: [birth year], 68 y.o.   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 Also has HTN and borderline DM. Also has neurogenic bowel and bladder and spasticity with pain pain and ITB pump- managed for pain as well- Dr Cornelius.  On fluid restriction 40 oz- due to low Sodium.  129-130 is good for him.    Here for f/u on SCI.    Things going OK  Struggling with BP dropping too low when sits up.  Doesn't feel bad when BP dropped low.  147/82- was 89/46 when got home BP dropping- even when laying down.  Takes hours to get back up- puts legs up and head down.   Tries to push fluid, but on restriction. 40 oz-   Not confused when BP low- occurs frequently.     Not having Sx's of infection In hospital in July for UTI- 5 days- Sodium drops when gets infection- Na 114- and 118- put in ICU and monitors him. Gets mental changes and gets very confused.   First sign of UTI is confusion fever doesn't run really high with UTI's or infections.   Was coughing so much, couldn't get it up- taking mucinex  BID Primary doctor to write for Albuterol - needs 2x/day- scheduled.    Pain Inventory Average Pain 7 Pain Right Now 8 My pain is constant, burning, dull, stabbing, tingling, and aching  In the last 24 hours, has pain interfered with the following? General activity 5 Relation with others 8 Enjoyment of life 8 What TIME of day is your pain at its worst? evening Sleep (in general) Fair  Pain is worse with: sitting Pain improves with: rest and medication Relief from Meds: 6  Family History  Problem Relation Age of Onset   Heart attack Father        Died in his 39's   Social  History   Socioeconomic History   Marital status: Married    Spouse name: Not on file   Number of children: Not on file   Years of education: Not on file   Highest education level: Not on file  Occupational History   Not on file  Tobacco Use   Smoking status: Never    Passive exposure: Never   Smokeless tobacco: Current    Types: Chew  Vaping Use   Vaping status: Never Used  Substance and Sexual Activity   Alcohol use: No   Drug use: No   Sexual activity: Not Currently  Other Topics Concern   Not on file  Social History Narrative   Not on file   Social Drivers of Health   Financial Resource Strain: Patient Declined (09/18/2021)   Received from [facility]   Overall Financial Resource Strain (CARDIA)    Difficulty of Paying Living Expenses: Patient declined  Food Insecurity: Low Risk  (11/16/2022)   Received from [facility]   Hunger Vital Sign    Within the past 12 months, you worried that your food would run out before you got money to buy more: Never true    Within the past 12 months, the food you bought just didn't last and you didn't have  money to get more. : Never true  Transportation Needs: No Transportation Needs (11/16/2022)   Received from [facility]    In the past 12 months, has lack of reliable transportation kept you from medical appointments, meetings, work or from getting things needed for daily living? : No  Physical Activity: Unknown (09/18/2021)   Received from [facility]   Exercise Vital Sign    On average, how many days per week do you engage in moderate to strenuous exercise (like a brisk walk)?: 0 days    Minutes of Exercise per Session: Not on file  Stress: Patient Declined (09/18/2021)   Received from [facility] - Occupational Stress Questionnaire    Feeling of Stress : Patient declined  Social Connections: Unknown (09/30/2022)   Received from [facility]   Social Network     Social Network: Not on file   Past Surgical History:  Procedure Laterality Date   ABDOMINAL SURGERY     BACK SURGERY     CYSTOSCOPY W/ URETERAL STENT PLACEMENT Right 02/22/2018   Procedure: CYSTOSCOPY, RIGHT RETROGRADE PYELOGRAM WITH URETERAL STENT PLACEMENT;  Surgeon: Gaston Hamilton, MD;  Location: [facility];  Service: Urology;  Laterality: Right;   CYSTOSCOPY/URETEROSCOPY/HOLMIUM LASER/STENT PLACEMENT Right 03/18/2018   Procedure: CYSTOSCOPY/URETEROSCOPY/HOLMIUM LASER/STENT PLACEMENT;  Surgeon: Carolee Sherwood JONETTA DOUGLAS, MD;  Location: [facility];  Service: Urology;  Laterality: Right;   INTRATHECAL PUMP IMPLANTATION Left 2000  ?   baclofin,clonidine,morphine infusion   Past Surgical History:  Procedure Laterality Date   ABDOMINAL SURGERY     BACK SURGERY     CYSTOSCOPY W/ URETERAL STENT PLACEMENT Right 02/22/2018   Procedure: CYSTOSCOPY, RIGHT RETROGRADE PYELOGRAM WITH URETERAL STENT PLACEMENT;  Surgeon: Gaston Hamilton, MD;  Location: [facility];  Service: Urology;  Laterality: Right;   CYSTOSCOPY/URETEROSCOPY/HOLMIUM LASER/STENT PLACEMENT Right 03/18/2018   Procedure: CYSTOSCOPY/URETEROSCOPY/HOLMIUM LASER/STENT PLACEMENT;  Surgeon: Carolee Sherwood JONETTA DOUGLAS, MD;  Location: [facility];  Service: Urology;  Laterality: Right;   INTRATHECAL PUMP IMPLANTATION Left 2000  ?   baclofin,clonidine,morphine infusion   Past Medical History:  Diagnosis Date   Candida cystitis 02/08/2022   Cellulitis and abscess of left leg 07/2016   Chronic back pain    Depression    H/O spinal cord injury    Headache    Lower extremity edema 09/13/2022   Lower urinary tract symptoms (LUTS) 06/26/2023   Recurrent UTI 09/13/2022   Sepsis (HCC) 07/2016   BP (!) 88/58 (BP Location: Right Arm, Patient Position: Sitting, Cuff Size: Normal) Comment: patient preference  Pulse (!) 108   Ht 5' 6 (1.676 m)   Wt 169 lb (76.7 kg) Comment: patient reported  SpO2 91%   BMI 27.28 kg/m   Opioid  Risk Score:   Fall Risk Score:  `1  Depression screen [facility] 2/9     09/07/2023    9:10 AM 06/27/2023    9:58 AM 05/30/2023    9:45 AM 02/07/2023   11:15 AM 11/22/2022   10:00 AM 08/21/2022    9:53 AM 05/22/2022    9:10 AM  Depression screen PHQ 2/9  Decreased Interest 1 0 2 0 1 1 1   Down, Depressed, Hopeless 0 0 1 0 1 1 1   PHQ - 2 Score 1 0 3 0 2 2 2   Altered sleeping  0       Tired, decreased energy  0 0      Change in appetite  0 0      Feeling bad or failure about yourself   0 0      Trouble concentrating  0 1      Moving slowly or fidgety/restless  0 1      Suicidal thoughts  0 0      PHQ-9 Score  0       Difficult doing work/chores  Not difficult at all Somewhat difficult          Review of Systems  Musculoskeletal:        Widespread pain throughout entire body, bilateral burning and stinging feeling of feet  All other systems reviewed and are negative.      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 Also has HTN and borderline DM. Also has neurogenic bowel and bladder and spasticity with pain pain and ITB pump- managed for pain as well- Dr Cornelius.   On fluid restriction 40 oz- due to low Sodium.  129-130 is good for him.   Here for f/u on SCI.    Need to try and balance low sodium and low BP- basically if not having Symptoms- and BP is >80/40s- then it's OK- however if he has lightheadedness/dizziness, confusion, then he needs drink more- because he's dry  (due to managing sodium) .  I don't think Midodrine  is appropriate unless drops below 80s for upper number- however, as detailed below.  2.  Will try Midodrine  as needed for low BP- -  5 mg 2x/day AS NEEDED- it lasts 4 hours- you  need to have him sit up at least 30 degrees when taking Midodrine . Could save him trip to ED  3. Pt needs Albuterol  nebs BID- but needs to get Rx from PCP. Can also take 2x/day as needed as well.   4. Mucinex - taking 2x/day.  To try and prevent pneumonia- Will need to give another 8oz of water /day to compensate- esp with low BP issues.   5. Received his MRI's- shows mild increase in inferior part of large syrinx- to T12- however no increase in size up from C7- last time I spoke with him, he didn't want surgery unless things got worse clinically  I see no reason needs additional surgery at this time.  It goes to C7, but his strength exam is consistent with T2.   6.  I went over with wife again, that syrinx surgery can cause more complications- so don't see need for surgery since strength in arms is great.    7. F/U in 3 months- double appt- SCI  8. Spoke with workers' comp about changes to medical issues.   9. Will refer to Pulmonary -because of worker's comp request- of note, pt's pneumonia that keeps occurring is due to the syrinx in his neck which has made his SCI at a higher level- so he cannot clear his secretions as well as a traditional, non SCI  pt- only his diaphragm is working, none of his intercostal muscles are working, so any time he get sa cough, he gets pneumonia.    I spent a total of 43   minutes on total care today- >50% coordination of care- due to d/w workers' comp- review of MRI with wife and pt and meds for BP since BP is so low.

## 2023-09-07 NOTE — Patient Instructions (Addendum)
 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 Also has HTN and borderline DM. Also has neurogenic bowel and bladder and spasticity with pain pain and ITB pump- managed for pain as well- Dr Cornelius.   On fluid restriction 40 oz- due to low Sodium.  129-130 is good for him.   Here for f/u on SCI.    Need to try and balance low sodium and low BP- basically if not having Symptoms- and BP is >80/40s- then it's OK- however if he has lightheadedness/dizziness, confusion, then he needs drink more- because he's dry (due to managing sodium) .  I don't think Midodrine  is appropriate unless drops below 80s for upper number- however, as detailed below.  2.  Will try Midodrine  as needed for low BP-  5 mg 2x/day AS NEEDED- it lasts 4 hours- you  need to have him sit up at least 30 degrees when taking Midodrine . Could save him trip to ED  3. Pt needs Albuterol  nebs BID- but needs to get Rx from PCP. Can also take 2x/day as needed as well.   4. Mucinex - taking 2x/day.  To try and prevent more frequent Pneumonia. Will need to give another 8oz of water /day to compensate- esp with low BP issues.   5. Received his MRI's- shows mild increase in inferior part of large syrinx- to T12- however no increase in size up from C7- last time I spoke with him, he didn't want surgery unless things got worse clinically  I see no reason needs additional surgery at this time.  It goes to C7, but his strength exam is consistent with T2.   6.  I went over with wife again, that syrinx surgery can cause more complications- so don't see need for surgery since strength in arms is great.    7. F/U in 3 months- double appt- SCI  8. Spoke with workers' comp about changes to medical issues.   9.  Will refer to Pulmonary -because of worker's comp request- of note, pt's pneumonia that keeps occurring is due to the syrinx in his neck which has made his SCI at a higher level- so he cannot clear his secretions as well as a traditional, non SCI pt- only his diaphragm is working, none of his intercostal muscles are working, so any time he get sa cough, he gets pneumonia.    I spent a total of 43   minutes on total care today- >50% coordination of care- due to d/w workers' comp- review of MRI with wife and pt and meds for BP since BP is so low.

## 2023-09-12 ENCOUNTER — Encounter: Payer: Self-pay | Admitting: Physical Medicine and Rehabilitation

## 2023-09-20 ENCOUNTER — Telehealth: Payer: Self-pay

## 2023-09-20 NOTE — Telephone Encounter (Signed)
 Faxed request for PA Midodrine  5 mg to workers [employer] for review. Request in cover my med key:  [id number] PA request faxed to [phone] attention:  Anissa Caroll, RN

## 2023-11-05 ENCOUNTER — Ambulatory Visit: Payer: Worker's Compensation | Admitting: Pulmonary Disease

## 2023-12-05 ENCOUNTER — Encounter
Payer: Worker's Compensation | Attending: Physical Medicine and Rehabilitation | Admitting: Physical Medicine and Rehabilitation

## 2023-12-05 ENCOUNTER — Encounter: Payer: Self-pay | Admitting: Physical Medicine and Rehabilitation

## 2023-12-05 VITALS — BP 91/58 | HR 97 | Ht 66.0 in

## 2023-12-05 DIAGNOSIS — G8220 Paraplegia, unspecified: Secondary | ICD-10-CM | POA: Insufficient documentation

## 2023-12-05 DIAGNOSIS — G894 Chronic pain syndrome: Secondary | ICD-10-CM | POA: Insufficient documentation

## 2023-12-05 DIAGNOSIS — R252 Cramp and spasm: Secondary | ICD-10-CM | POA: Diagnosis not present

## 2023-12-05 DIAGNOSIS — Z993 Dependence on wheelchair: Secondary | ICD-10-CM | POA: Diagnosis present

## 2023-12-05 DIAGNOSIS — G950 Syringomyelia and syringobulbia: Secondary | ICD-10-CM | POA: Insufficient documentation

## 2023-12-05 DIAGNOSIS — Z978 Presence of other specified devices: Secondary | ICD-10-CM | POA: Insufficient documentation

## 2023-12-05 MED ORDER — POLYETHYLENE GLYCOL 3350 17 G PO PACK
17.0000 g | PACK | Freq: Every day | ORAL | 3 refills | Status: AC
Start: 2023-12-05 — End: ?

## 2023-12-05 MED ORDER — SILVER SULFADIAZINE 1 % EX CREA: 1.0000 | TOPICAL_CREAM | Freq: Two times a day (BID) | CUTANEOUS | 3 refills | Status: AC

## 2023-12-05 MED ORDER — LUBIPROSTONE 24 MCG PO CAPS: 24.0000 ug | ORAL_CAPSULE | Freq: Two times a day (BID) | ORAL | 11 refills | Status: AC

## 2023-12-05 MED ORDER — TIZANIDINE HCL 4 MG PO TABS: 4.0000 mg | ORAL_TABLET | Freq: Two times a day (BID) | ORAL | 3 refills | Status: AC

## 2023-12-05 NOTE — Patient Instructions (Signed)
 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 Also has HTN and borderline DM. Also has neurogenic bowel and bladder and spasticity with pain pain and ITB pump- managed for pain as well- Dr Cornelius.  On fluid restriction 40 oz- due to low Sodium.  129-130 is good for him.    Here for f/u on SCI.   His BP is low- but it's pretty normal  in a T2 paraplegic.  As long as not symptomatic, I'm not concerned about current BP.     2.  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  Try to treat what the issue is- unkink catheter, do another bowel program -figuring out the cause and treating is the treatment, not giving BP meds.  3.  At risk for fractures, due to osteoporosis that comes with being a chronic SCI- there is no treatment for the osteoporosis-unfortunately.   4. February will be 5 years on hoyer lift-  should be able to keep the old one in garage (since cannot get hoyer lift into garage)- and then order a new light weight hoyer lift in February, will order. The current hoyer lift won't stay charged- so needs to keep charging almost all the time.    5.   Mild - moderate increase in CV disease risk   (MI or stroke)because his SCI is above T6- due to increased risk of AD, as above- as well  as wildly variable BP's that strain the intima of blood vessels and increased CRP/ inflammatory marker that goes with being a SCI.    6.  Increased risk of UTIs due to SCI and in addition, the  Chronic foley-  which makes SCI the main reason he's getting UTI-s men without neurogenic bladder rarely get UTI's on their own.    8. Due to T2 paraplegia,  at risk for pneumonia due to higher SCI- which we've discussed I past- it's the reason he's having pneumonia intermittently.    9. Needs to get Flu shot ASAP. It's really starting to be seen.    10. Power W/c is ~ 3 years ago-  w/c was from late 2022- same as cushion.    11. F/U in 3months double appt- SCI  12.  Con't Amitiza - 24 mg BID- sent in 1 year refills  13. Haven't had to use Midodrine - 5 mg BID prn- for low BP.   14. Con't Guafenesin- has refills  15.  Con't Tizanidine  4 mg 2x/day- for spasticity- 3 months supply  16 Con't Miralax  and Silvadene  for 3months supply- for 1 year- sent in refills

## 2023-12-05 NOTE — Progress Notes (Signed)
 Subjective:    Patient ID: Robert Mays, male    DOB: 20-Apr-1955, 68 y.o.   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 Also has HTN and borderline DM. Also has neurogenic bowel and bladder and spasticity with pain pain and ITB pump- managed for pain as well- Dr Cornelius.  On fluid restriction 40 oz- due to low Sodium.  129-130 is good for him.    Here for f/u on SCI.    Things OK, I guess  BP was 91/58 and 88/54- - is low again today.   Going to get Flu shot- doesn't get COVID vaccines.  Also needs shingles vaccine.    Has been in hospital this year 3x- 1 for pneumonia- other 2 were UTI's.    Pain same old pains.     Pain Inventory Average Pain 7 Pain Right Now 7 My pain is burning, stabbing, tingling, and aching  In the last 24 hours, has pain interfered with the following? General activity 4 Relation with others 5 Enjoyment of life 5 What TIME of day is your pain at its worst? daytime Sleep (in general) Fair  Pain is worse with: sitting Pain improves with: rest Relief from Meds: 5  Family History  Problem Relation Age of Onset   Heart attack Father        Died in his 83's   Social History   Socioeconomic History   Marital status: Married    Spouse name: Not on file   Number of children: Not on file   Years of education: Not on file   Highest education level: Not on file  Occupational History   Not on file  Tobacco Use   Smoking status: Never    Passive exposure: Never   Smokeless tobacco: Current    Types: Chew  Vaping Use   Vaping status: Never Used  Substance and Sexual Activity   Alcohol use: No   Drug use: No   Sexual activity: Not Currently  Other Topics Concern   Not on  file  Social History Narrative   Not on file   Social Drivers of Health   Financial Resource Strain: Patient Declined (09/18/2021)   Received from [facility]   Overall Financial Resource Strain (CARDIA)    Difficulty of Paying Living Expenses: Patient declined  Food Insecurity: Low Risk  (11/16/2022)   Received from [facility]   Hunger Vital Sign    Within the past 12 months, you worried that your food would run out before you got money to buy more: Never true    Within the past 12 months, the food you bought just didn't last and you didn't have money to get more. : Never true  Transportation Needs: No Transportation Needs (11/16/2022)   Received from [facility]    In the past 12 months, has lack of reliable transportation kept you from medical appointments, meetings, work or from getting things needed for daily living? : No  Physical Activity: Unknown (09/18/2021)   Received from [facility]   Exercise Vital Sign    On average, how many days per week do you engage in moderate to strenuous exercise (like a brisk walk)?: 0 days  Minutes of Exercise per Session: Not on file  Stress: Patient Declined (09/18/2021)   Received from [facility] - Occupational Stress Questionnaire    Feeling of Stress : Patient declined  Social Connections: Unknown (09/30/2022)   Received from [facility]   Social Network    Social Network: Not on file   Past Surgical History:  Procedure Laterality Date   ABDOMINAL SURGERY     BACK SURGERY     CYSTOSCOPY W/ URETERAL STENT PLACEMENT Right 02/22/2018   Procedure: CYSTOSCOPY, RIGHT RETROGRADE PYELOGRAM WITH URETERAL STENT PLACEMENT;  Surgeon: Gaston Hamilton, MD;  Location: [facility];  Service: Urology;  Laterality: Right;   CYSTOSCOPY/URETEROSCOPY/HOLMIUM LASER/STENT PLACEMENT Right 03/18/2018   Procedure: CYSTOSCOPY/URETEROSCOPY/HOLMIUM LASER/STENT PLACEMENT;  Surgeon: Carolee Sherwood JONETTA DOUGLAS, MD;  Location[facility];  Service: Urology;  Laterality: Right;   INTRATHECAL PUMP IMPLANTATION Left 2000  ?   baclofin,clonidine,morphine infusion   Past Surgical History:  Procedure Laterality Date   ABDOMINAL SURGERY     BACK SURGERY     CYSTOSCOPY W/ URETERAL STENT PLACEMENT Right 02/22/2018   Procedure: CYSTOSCOPY, RIGHT RETROGRADE PYELOGRAM WITH URETERAL STENT PLACEMENT;  Surgeon: Gaston Hamilton, MD;  Location: [facility];  Service: Urology;  Laterality: Right;   CYSTOSCOPY/URETEROSCOPY/HOLMIUM LASER/STENT PLACEMENT Right 03/18/2018   Procedure: CYSTOSCOPY/URETEROSCOPY/HOLMIUM LASER/STENT PLACEMENT;  Surgeon: Carolee Sherwood JONETTA DOUGLAS, MD;  Location: [facility];  Service: Urology;  Laterality: Right;   INTRATHECAL PUMP IMPLANTATION Left 2000  ?   baclofin,clonidine,morphine infusion   Past Medical History:  Diagnosis Date   Candida cystitis 02/08/2022   Cellulitis and abscess of left leg 07/2016   Chronic back pain    Depression    H/O spinal cord injury    Headache    Lower extremity edema 09/13/2022   Lower urinary tract symptoms (LUTS) 06/26/2023   Recurrent UTI 09/13/2022   Sepsis (HCC) 07/2016   BP (!) 91/58   Pulse 97   Ht 5' 6 (1.676 m)   SpO2 92%   BMI 27.28 kg/m   Opioid Risk Score:   Fall Risk Score:  `1  Depression screen [facility] 2/9     09/07/2023    9:10 AM 06/27/2023    9:58 AM 05/30/2023    9:45 AM 02/07/2023   11:15 AM 11/22/2022   10:00 AM 08/21/2022    9:53 AM 05/22/2022    9:10 AM  Depression screen PHQ 2/9  Decreased Interest 1 0 2 0 1 1 1   Down, Depressed, Hopeless 0 0 1 0 1 1 1   PHQ - 2 Score 1 0 3 0 2 2 2   Altered sleeping  0       Tired, decreased energy  0 0      Change in appetite  0 0      Feeling bad or failure about yourself   0 0      Trouble concentrating  0 1      Moving slowly or fidgety/restless  0 1      Suicidal thoughts  0 0      PHQ-9 Score  0        Difficult doing work/chores  Not difficult at  all Somewhat difficult         Data saved with a previous flowsheet row definition     Review of Systems  Musculoskeletal:  Positive for back pain and myalgias.  All other systems reviewed and are negative.  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 Also has HTN and borderline DM. Also has neurogenic bowel and bladder and spasticity with pain pain and ITB pump- managed for pain as well- Dr Cornelius.  On fluid restriction 40 oz- due to low Sodium.  129-130 is good for him.    Here for f/u on SCI.   His BP is low- but it's pretty normal  in a T2 paraplegic.  As long as not symptomatic, I'm not concerned about current BP.     2.  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  Try to treat what the issue is- unkink catheter, do another bowel program -figuring out the cause and treating is the treatment,  not giving BP meds.  3.  At risk for fractures, due to osteoporosis that comes with being a chronic SCI- there is no treatment for the osteoporosis-unfortunately.  Most SCI's are osteoporotic by year 2 and 5 years, all are osteoporotic- don't see reason to get bone density scan since we know osteoporotic.    4. February will be 5 years on hoyer lift-  should be able to keep the old one in garage (since cannot get hoyer lift into garage)- and then order a new light weight hoyer lift in February, will order. The current hoyer lift won't stay charged- so needs to keep charging almost all the time.    5.   Mild - moderate increase in CV disease risk   (MI or stroke)because his SCI is above T6- due to increased risk of AD, as above- as well as wildly variable BP's that strain the intima of blood vessels and increased CRP/ inflammatory marker that goes with being a SCI.    6.  Increased risk of UTIs due to SCI and in addition, the  Chronic foley-  which makes SCI the main reason he's getting UTI-s men without neurogenic bladder rarely get UTI's on their own.    8. Due to T2 paraplegia,  at risk for pneumonia due to higher SCI- which we've discussed I past- it's the reason he's having pneumonia intermittently.    9. Needs to get Flu shot ASAP. It's really starting to be seen.    10. Power W/c is ~ 3 years ago-  w/c was from late 2022-  same as cushion.    11. F/U in 3months double appt- SCI  12.  Con't Amitiza - 24 mg BID- sent in 1 year refills  13. Haven't had to use Midodrine - 5 mg BID prn- for low BP.   - use midodrine  when feels swimmy headed , and blurry vision. Only lasts 4 hours.   14. Con't Guafenesin- has refills  15.  Con't Tizanidine  4 mg 2x/day- for spasticity- 3 months supply  16 Con't Miralax  and Silvadene  for 3months supply- for 1 year- sent in refills   I spent a total of 43   minutes on total care today- >50% coordination of care- due to d/w pt and wife and education on  AD< risk of MI/stroke and osteoporosis- and refills and d/w worker's comp person

## 2023-12-06 ENCOUNTER — Ambulatory Visit (INDEPENDENT_AMBULATORY_CARE_PROVIDER_SITE_OTHER): Payer: Worker's Compensation | Admitting: Pulmonary Disease

## 2023-12-06 ENCOUNTER — Encounter: Payer: Self-pay | Admitting: Pulmonary Disease

## 2023-12-06 VITALS — BP 98/62 | HR 86 | Ht 66.0 in

## 2023-12-06 DIAGNOSIS — M6281 Muscle weakness (generalized): Secondary | ICD-10-CM | POA: Diagnosis not present

## 2023-12-06 DIAGNOSIS — G8220 Paraplegia, unspecified: Secondary | ICD-10-CM

## 2023-12-06 DIAGNOSIS — J189 Pneumonia, unspecified organism: Secondary | ICD-10-CM | POA: Diagnosis not present

## 2023-12-06 DIAGNOSIS — R058 Other specified cough: Secondary | ICD-10-CM | POA: Diagnosis not present

## 2023-12-06 DIAGNOSIS — Z993 Dependence on wheelchair: Secondary | ICD-10-CM

## 2023-12-06 MED ORDER — SODIUM CHLORIDE 3 % IN NEBU: INHALATION_SOLUTION | Freq: Two times a day (BID) | RESPIRATORY_TRACT | 12 refills | Status: DC

## 2023-12-06 MED ORDER — SODIUM CHLORIDE 3 % IN NEBU
INHALATION_SOLUTION | Freq: Two times a day (BID) | RESPIRATORY_TRACT | 12 refills | Status: AC
Start: 2023-12-06 — End: ?

## 2023-12-06 NOTE — Progress Notes (Signed)
 @Patient  ID: Robert Mays, male    DOB: 1955/08/29, 68 y.o.   MRN: [id number]  Chief Complaint  Patient presents with   Consult    Pt state / advocated pulm MD view on things he could benefit / use neb machine daily / inh if need     Referring provider: Cornelio Bouchard, MD  HPI:   68 y.o. man history of T-spine injury at work in 1988 whom are seen for recurrent pneumonias and weak cough.  Note from referring provider reviewed.  Injured on job in 1998.  Fell up stairs back hit metal arm rail.  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  Currently wheelchair-bound.  Has had intermittent pneumonias since that illness.  But really since 2021 they have been much more frequent.  He goes to the hospital.  Gets chest x-rays and CT scans.  Diagnosed with pneumonia.  He is hospitalized with hypoxemia.  Improved over time with breathing treatments and antibiotics.  I asked him to cough and it was very weak.  He calls every day to help.  Brings up phlegm.  Used to not for the injury.  Sometimes small sometimes large thick phlegm.  Sometimes he feel like he is choking.  Difficulty getting up phlegm is main problem.  He is a never smoker.  Most recent chest x-ray 08/2023 via [facility] reveals clear lungs and elevated right hemidiaphragm on my review and interpretation.  Reviewed check today 05/2023 with left-sided infiltrate on my interpretation.  Questionaires / Pulmonary Flowsheets:   ACT:      No data to display          MMRC:     No data to display          Epworth:      No data to display          Tests:   FENO:  No results found for: NITRICOXIDE  PFT:     No data to display          WALK:      No data to display          Imaging: Personally viewed as per EMR and discussion this note No results found.  Lab Results: Personally reviewed CBC    Component Value Date/Time   WBC 18.1 (H) 02/22/2018 1535   RBC 3.61 (L) 02/22/2018 1535   HGB 10.8 (L) 02/22/2018 1535    HCT 32.8 (L) 02/22/2018 1535   PLT 397 02/22/2018 1535   MCV 90.9 02/22/2018 1535   MCH 29.9 02/22/2018 1535   MCHC 32.9 02/22/2018 1535   RDW 13.5 02/22/2018 1535   LYMPHSABS 1.0 08/09/2016 2301   MONOABS 0.9 08/09/2016 2301   EOSABS 0.2 08/09/2016 2301   BASOSABS 0.0 08/09/2016 2301    BMET    Component Value Date/Time   NA 128 (L) 02/22/2018 1535   K 4.5 02/22/2018 1535   CL 93 (L) 02/22/2018 1535   CO2 24 02/22/2018 1535   GLUCOSE 104 (H) 02/22/2018 1535   BUN 9 02/22/2018 1535   CREATININE 1.00 02/22/2018 1535   CALCIUM 8.9 02/22/2018 1535   GFRNONAA >60 02/22/2018 1535   GFRAA >60 02/22/2018 1535    BNP No results found for: BNP  ProBNP No results found for: PROBNP  Specialty Problems       Pulmonary Problems   Recurrent aspiration pneumonia (HCC)   Frequent episodes of bronchitis and pneumonia    Allergies  Allergen Reactions   Sulfa  Antibiotics Rash  Immunization History  Administered Date(s) Administered   INFLUENZA, HIGH DOSE SEASONAL PF 01/13/2019, 01/13/2019, 11/24/2019, 11/24/2019   Influenza Split 10/19/2014   Influenza,inj,Quad PF,6+ Mos 12/21/2015, 01/02/2018   Influenza-Unspecified 01/23/2022   Pneumococcal Conjugate-13 11/20/2016, 01/13/2019   Pneumococcal Polysaccharide-23 01/15/2020   Td (Adult),5 Lf Tetanus Toxid, Preservative Free 01/24/2011    Past Medical History:  Diagnosis Date   Candida cystitis 02/08/2022   Cellulitis and abscess of left leg 07/2016   Chronic back pain    Depression    H/O spinal cord injury    Headache    Lower extremity edema 09/13/2022   Lower urinary tract symptoms (LUTS) 06/26/2023   Recurrent UTI 09/13/2022   Sepsis (HCC) 07/2016    Tobacco History: Social History   Tobacco Use  Smoking Status Never   Passive exposure: Never  Smokeless Tobacco Current   Types: Chew   Ready to quit: Not Answered Counseling given: Not Answered   Continue to not smoke  Outpatient Encounter  Medications as of 12/06/2023  Medication Sig   albuterol  (PROVENTIL ) (2.5 MG/3ML) 0.083% nebulizer solution Inhale into the lungs.   apixaban (ELIQUIS) 5 MG TABS tablet Take 5 mg by mouth.   aspirin  EC 81 MG tablet Take 81 mg by mouth daily.   atorvastatin (LIPITOR) 10 MG tablet Take 1 tablet by mouth daily.   baclofen  (LIORESAL ) 20 MG tablet Take 20 mg by mouth 3 (three) times daily.   cloNIDine HCl, Analgesia, (CLONIDINE HCL, BULK,) 250 MG/50ML SOLN 92,003 mcg by Does not apply route daily. Given via intrathecal pain pump   dicyclomine (BENTYL) 10 MG capsule TAKE 1 CAPSULE BY MOUTH EVERY 6 HOURS ASNEEDED   fluconazole  (DIFLUCAN ) 100 MG tablet Take 2 tabs x 14 days for UTI and have catheter changed by urology asap Take 1 tab daily while on antibacterial antibiotics   gabapentin  (NEURONTIN ) 300 MG capsule Take 300 mg by mouth 3 (three) times daily.   guaiFENesin  (GOODSENSE MUCUS ER) 600 MG 12 hr tablet Take 1 tablet (600 mg total) by mouth 2 (two) times daily. To thin secretions   lubiprostone  (AMITIZA ) 24 MCG capsule Take 1 capsule (24 mcg total) by mouth 2 (two) times daily with a meal. For neurogenic bowel   lubiprostone  (AMITIZA ) 24 MCG capsule Take 1 capsule (24 mcg total) by mouth 2 (two) times daily with a meal.   midodrine  (PROAMATINE ) 5 MG tablet Take 1 tablet (5 mg total) by mouth 2 (two) times daily as needed. Take midodrine  if Symptomatic OR BP <80/40s- it only lasts 4 hours   morphine 10 MG/5ML solution Take by mouth every 2 (two) hours as needed for severe pain. Per patient, taking 1.804 mg/day via intrathecal pain pump   naloxone (NARCAN) nasal spray 4 mg/0.1 mL Place 1 spray into the nose once.   omeprazole (PRILOSEC) 20 MG capsule Take 20 mg by mouth daily.   ondansetron  (ZOFRAN ) 8 MG tablet Take 8 mg by mouth every 8 (eight) hours as needed for nausea/vomiting.   Oxcarbazepine  (TRILEPTAL ) 300 MG tablet Take 300 mg by mouth 3 (three) times daily.   oxybutynin  (DITROPAN -XL) 10 MG  24 hr tablet Take 20 mg by mouth.   oxycodone  (ROXICODONE ) 30 MG immediate release tablet Take 30 mg by mouth 2 (two) times daily.   polyethylene glycol (MIRALAX ) 17 g packet Take 17 g by mouth daily. For neurogenic bowel- along with Amitiza . If doesn't take, gets backed up.   Probiotic Product (PROBIOTIC DAILY PO) Take by mouth.  silver  sulfADIAZINE  (SILVADENE ) 1 % cream Apply 1 Application topically 2 (two) times daily. For buttocks to heal wounds-   sodium chloride  1 g tablet Take 1 g by mouth.   tapentadol (NUCYNTA) 50 MG 12 hr tablet Take by mouth every 12 (twelve) hours.   tiZANidine  (ZANAFLEX ) 4 MG tablet Take 1 tablet (4 mg total) by mouth in the morning and at bedtime.   triamcinolone (KENALOG) 0.025 % cream Use 2 x/day for inflammation   venlafaxine  (EFFEXOR ) 75 MG tablet Take 75 mg by mouth 2 (two) times daily.   [DISCONTINUED] sodium chloride  HYPERTONIC 3 % nebulizer solution Take by nebulization 2 (two) times daily.   sodium chloride  HYPERTONIC 3 % nebulizer solution Take by nebulization 2 (two) times daily.   No facility-administered encounter medications on file as of 12/06/2023.     Review of Systems  Review of Systems  No chest pain.  No orthopnea or PND.  Comprehensive review of systems otherwise negative. Physical Exam  BP 98/62   Pulse 86   Ht 5' 6 (1.676 m) Comment: per pt  SpO2 98%   BMI 27.28 kg/m   Wt Readings from Last 5 Encounters:  09/07/23 169 lb (76.7 kg)  06/27/23 215 lb (97.5 kg)  02/28/23 215 lb (97.5 kg)  11/22/22 207 lb (93.9 kg)  08/21/22 207 lb (93.9 kg)    BMI Readings from Last 5 Encounters:  12/06/23 27.28 kg/m  12/05/23 27.28 kg/m  09/07/23 27.28 kg/m  06/27/23 34.70 kg/m  02/28/23 34.70 kg/m     Physical Exam  General: Sitting in motorized wheelchair, no distress Eyes: EOMI, no icterus Neck: No JVP appreciated Pulmonary: Clear, diminished throughout Cardiovascular: Warm  Assessment & Plan:   Recurrent pneumonia:  Due to neuromuscular weakness as a result of T-spine injury.  Weak cough: Related neuromuscular weakness after T-spine injury, paraplegia.  Very weak cough on exam.  Continue airway clearance with albuterol , add saline nebulizer.  New order today for CoughAssist device.  Given his very weak cough I think he really needs mechanical support it is medically necessary for the mechanical support to prevent worsening respiratory disease, clinical decompensation, hospitalization.   Return in about 6 months (around 06/04/2024) for f/u Dr. Annella.   Donnice JONELLE Annella, MD 12/06/2023

## 2023-12-06 NOTE — Patient Instructions (Signed)
 Nice to meet you  Work on the trials CoughAssist device, this will force area in the lung and then suck air out.  I recommend performing 6 cycles of this then resting.  Repeat this 5 times.  This would be 1 session.  Perform 4 sessions a day.  In addition, use hypertonic saline nebulizers twice a day.  These efforts are to loosen sputum and prevent back up and pneumonias.  Return to clinic in 6 months or sooner as needed with Dr. Annella

## 2023-12-11 ENCOUNTER — Telehealth: Payer: Self-pay

## 2023-12-11 NOTE — Telephone Encounter (Signed)
 This is a question for a [facility]. This is a DME order for cough assist device. The order was provided via paper to the patients worker's comp child psychotherapist. Social worker is requesting which DME company to send the order to. Can the PCCs provide a DME company where the SW can send the order?

## 2023-12-11 NOTE — Telephone Encounter (Deleted)
 They can send the DME order to [employer] or [facility].

## 2023-12-11 NOTE — Telephone Encounter (Signed)
 Copied from CRM #[id number]. Topic: Clinical - Order For Equipment >> Dec 10, 2023  4:46 PM Lavanda D wrote: Reason for CRM: Ronal with [employer] is calling regarding order for the cough assist device. She would like to find out vendor the clinic typically uses for cough assist devices. Patient's wife said that the Doctor told her that someone would have to come to their house and set up the device. She said that she doesn't see it done this way typically but would like to know what vendor they use to deliver the device + set it up. CB#: [phone] EXT: [id number]    Per lov Work on the trials CoughAssist device, this will force area in the lung and then suck air out. I recommend performing 6 cycles of this then resting. Repeat this 5 times. This would be 1 session. Perform 4 sessions a day. Dr. Annella can you provide more information regarding this.

## 2023-12-11 NOTE — Telephone Encounter (Signed)
 I spoke to [facility] at [facility] and informed her that the pt did not have a DME company they used but that they could contact [facility] and [employer] to see who is able to accomodates the pts needs. Mary verbalized understanding and will be reaching out to DMEs. NFN

## 2023-12-14 NOTE — Telephone Encounter (Signed)
 Copied from CRM #[id number]. Topic: Clinical - Medical Advice >> Dec 13, 2023  9:56 AM Benton KIDD wrote: Reason for CRM: workers comp case child psychotherapist calling to see about the cough assist device for patient that dr annella ordered  . Wife and patient  is going to need help on how to use the coughing device  case manager is wanting to know can they book a appointment to be shown how to use this device . Please give case manager a call concerning this  [phone] Anissa  I called and spoke with Anissa, I scheduled an in office visit for 12/10 at 1:30 pm, to arrive by 1:15 pm for check in.  They will bring the cough assist device with them to the appointment.  She is expecting it to arrive the first week of December.  Nothing further needed.

## 2023-12-27 ENCOUNTER — Ambulatory Visit: Payer: Self-pay | Admitting: Infectious Disease

## 2024-01-02 ENCOUNTER — Ambulatory Visit: Payer: Self-pay | Admitting: Pulmonary Disease

## 2024-01-07 ENCOUNTER — Other Ambulatory Visit: Payer: Self-pay

## 2024-01-07 ENCOUNTER — Telehealth: Payer: Self-pay

## 2024-01-07 ENCOUNTER — Other Ambulatory Visit

## 2024-01-07 DIAGNOSIS — N390 Urinary tract infection, site not specified: Secondary | ICD-10-CM

## 2024-01-07 LAB — URINE CULTURE
MICRO NUMBER:: 17356598
SPECIMEN QUALITY:: ADEQUATE

## 2024-01-07 NOTE — Telephone Encounter (Signed)
 Patient's wife called stating the patient has an appointment with urology today for a catheter change and they would like to collect a specimen for a urine culture and bring it to our office. She reports the patient has had a low grade fever and urology and would prefer to have urine culture done at our office. She can bring the urine specimen this after noon Shadonna Benedick ONEIDA Ligas, CMA

## 2024-01-08 ENCOUNTER — Other Ambulatory Visit: Payer: Self-pay

## 2024-01-08 ENCOUNTER — Telehealth: Payer: Self-pay

## 2024-01-08 DIAGNOSIS — G8220 Paraplegia, unspecified: Secondary | ICD-10-CM

## 2024-01-08 NOTE — Telephone Encounter (Signed)
 Copied from CRM #[id number]. Topic: Clinical - Order For Equipment >> Jan 02, 2024  9:22 AM Ismael A wrote: Reason for CRM: needs order for tubing and full face mask to use for cough assist device - please fax to Anissa at [facility] fax# [phone] >> Jan 07, 2024  8:51 AM Corean SAUNDERS wrote: Anissa, patients nurse case manager is requesting an update as the request was made on 12/10   Did not see a order placed a order could not get a hold of case worker

## 2024-01-09 ENCOUNTER — Telehealth: Payer: Self-pay | Admitting: Infectious Disease

## 2024-01-09 ENCOUNTER — Telehealth: Payer: Self-pay

## 2024-01-09 MED ORDER — POLYETHYLENE GLYCOL 3350 17 GM/SCOOP PO POWD: 17.0000 g | Freq: Every day | ORAL | 1 refills | Status: AC

## 2024-01-09 MED ORDER — AMOXICILLIN 500 MG PO CAPS: 500.0000 mg | ORAL_CAPSULE | Freq: Three times a day (TID) | ORAL | 0 refills | Status: AC

## 2024-01-09 NOTE — Telephone Encounter (Signed)
 LVM for patient/patient's daughter letting them know that these have been sent in. Asked that they call back with any questions or issues.

## 2024-01-09 NOTE — Telephone Encounter (Signed)
 Given he has fevers and he has GPC in urine and hx of enterococcus I have sent in amoxicillin  to treat a possible UTI.  I dont like doing this without more data but maybe this might save him an ER trip

## 2024-01-09 NOTE — Telephone Encounter (Signed)
 Mr. Robert Mays's worker's comp case manager Robert Mays called:    Mr. Robert Mays  needs the container of Miralax  ordered. Workers-comp will not pay for the packets. Please send to [facility]. Thank you.

## 2024-01-09 NOTE — Telephone Encounter (Signed)
 Spoke to patient's wife and advised her of results and that RX has been sent

## 2024-01-14 NOTE — Telephone Encounter (Signed)
 I advised the patient's wife that the patient needs further evaluation and that he still should not be having fevers after 5 days of antibiotics. I advised her that I am happy to forwarded his results to urology or pcp for their advice if she does not wish to take him to the ER. She requested results be sent to urology. I have forwarded the results.  Merlene Dante ONEIDA Ligas, CMA

## 2024-01-14 NOTE — Telephone Encounter (Unsigned)
 Copied from CRM #[id number]. Topic: Clinical - Order For Equipment >> Jan 02, 2024  9:22 AM Ismael A wrote: Reason for CRM: needs order for tubing and full face mask to use for cough assist device - please fax to Anissa at [facility] fax# [phone] >> Jan 11, 2024  2:01 PM Corean SAUNDERS wrote: Anissa needs the equipment order faxed to her as soon as possible at Fax: [phone] as she needs to run the order to workers comp.  >> Jan 07, 2024  8:51 AM Corean R wrote: Anissa, patients nurse case manager is requesting an update as the request was made on 12/10

## 2024-01-14 NOTE — Telephone Encounter (Signed)
 Patient wife called requesting urine culture results. Patient continued to run a fever through Saturday. Patient finished last dose of abx this morning. Requesting abx for 2-3 more days, patient wife afraid he will end up in the hospital and doesn't want that to happen on christmas.     Miki Blank SHAUNNA Letters, CMA

## 2024-01-14 NOTE — Telephone Encounter (Signed)
 Patient's wife is stating she would like for the patient to have 2 additional days of antibiotics. She states he typically needs at least 7 days of antibiotics with this type of UTI. She doesn't want to take him the hospital.

## 2024-01-15 ENCOUNTER — Telehealth: Payer: Self-pay | Admitting: Pulmonary Disease

## 2024-01-15 NOTE — Telephone Encounter (Signed)
 Copied from CRM [id number]. Topic: Medical Record Request - Records Request >> Jan 15, 2024  8:29 AM Joesph PARAS wrote: Reason for CRM: Anissa is needing the order for patient's DME faxed to her. She has requested several times and it has not been done. Please fax to [phone] ASAP, has requested 3x since 12/10 and has not received. If questions, c/b at [phone].

## 2024-01-22 NOTE — Telephone Encounter (Signed)
 Per phone note dated 01/08/24- [place] said this was completed already on 01/14/24. I tried calling Anisa to ensure faxed was received and her VM states out of the office until 01/23/24. Will try calling back then.

## 2024-02-13 ENCOUNTER — Ambulatory Visit: Payer: Self-pay | Admitting: Infectious Disease

## 2024-02-14 ENCOUNTER — Ambulatory Visit: Payer: Worker's Compensation | Admitting: Pulmonary Disease

## 2024-02-14 ENCOUNTER — Encounter: Payer: Self-pay | Admitting: Pulmonary Disease

## 2024-02-14 VITALS — BP 123/81 | HR 87 | Temp 97.8°F | Wt 200.0 lb

## 2024-02-14 DIAGNOSIS — R058 Other specified cough: Secondary | ICD-10-CM | POA: Diagnosis not present

## 2024-02-14 DIAGNOSIS — G8220 Paraplegia, unspecified: Secondary | ICD-10-CM | POA: Diagnosis not present
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
## 2024-02-14 NOTE — Patient Instructions (Addendum)
 I am glad you are doing well  Continue your breathing treatments as you are  The CoughAssist device certainly has a learning curve.  I am sorry the medical equipment, did not send someone to teach you.  We will call and message and see if they can get someone out to provide education in the home.  If you would reach out to your local contact as well that would be great.  We will attack it from both ends.  Work on the trials CoughAssist device, this will force area in the lung and then suck air out. I recommend performing 6 cycles of this then resting. Repeat this 5 times. This would be 1 session. Perform 3-4 sessions a day.   Return to clinic in 4 months or sooner as needed with Dr. Annella, appointment not scheduled yet, you can make an appointment if you would like on the way out

## 2024-02-20 ENCOUNTER — Other Ambulatory Visit: Payer: Self-pay

## 2024-02-20 DIAGNOSIS — G8220 Paraplegia, unspecified: Secondary | ICD-10-CM

## 2024-02-21 ENCOUNTER — Other Ambulatory Visit: Payer: Self-pay

## 2024-02-21 ENCOUNTER — Encounter: Payer: Self-pay | Admitting: Internal Medicine

## 2024-02-21 ENCOUNTER — Ambulatory Visit: Payer: Self-pay | Admitting: Internal Medicine

## 2024-02-21 VITALS — BP 123/77 | HR 85 | Temp 98.5°F | Resp 16

## 2024-02-21 DIAGNOSIS — N390 Urinary tract infection, site not specified: Secondary | ICD-10-CM

## 2024-02-21 MED ORDER — FLUCONAZOLE 100 MG PO TABS: ORAL_TABLET | ORAL | 1 refills | Status: AC
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
## 2024-02-21 NOTE — Patient Instructions (Signed)
 F/U with Dr. Fleeta Rothman in 3 months

## 2024-02-26 ENCOUNTER — Telehealth: Payer: Self-pay

## 2024-02-26 NOTE — Telephone Encounter (Signed)
 Patient wife called requesting lab results.

## 2024-02-27 LAB — CULTURE, BLOOD (SINGLE)
MICRO NUMBER:: 17528290
MICRO NUMBER:: 17528328
Result:: NO GROWTH
Result:: NO GROWTH
SPECIMEN QUALITY:: ADEQUATE
SPECIMEN QUALITY:: ADEQUATE

## 2024-03-12 ENCOUNTER — Encounter: Admitting: Physical Medicine and Rehabilitation

## 2024-05-07 ENCOUNTER — Ambulatory Visit: Payer: Self-pay | Admitting: Infectious Disease

## 2024-06-11 ENCOUNTER — Ambulatory Visit: Admitting: Pulmonary Disease
